# Patient Record
Sex: Male | Born: 1940 | Race: White | Hispanic: No | Marital: Married | Smoking: Former smoker
Health system: Southern US, Community
[De-identification: ages and names within clinical notes are randomized; demographics above are authoritative.]

## PROBLEM LIST (undated history)

## (undated) DIAGNOSIS — R9389 Abnormal findings on diagnostic imaging of other specified body structures: Secondary | ICD-10-CM

## (undated) DIAGNOSIS — Z7901 Long term (current) use of anticoagulants: Secondary | ICD-10-CM

## (undated) DIAGNOSIS — E669 Obesity, unspecified: Secondary | ICD-10-CM

## (undated) DIAGNOSIS — I456 Pre-excitation syndrome: Secondary | ICD-10-CM

## (undated) DIAGNOSIS — I480 Paroxysmal atrial fibrillation: Secondary | ICD-10-CM

## (undated) DIAGNOSIS — R7302 Impaired glucose tolerance (oral): Secondary | ICD-10-CM

## (undated) DIAGNOSIS — I259 Chronic ischemic heart disease, unspecified: Secondary | ICD-10-CM

## (undated) DIAGNOSIS — I10 Essential (primary) hypertension: Secondary | ICD-10-CM

## (undated) DIAGNOSIS — IMO0001 Reserved for inherently not codable concepts without codable children: Secondary | ICD-10-CM

## (undated) DIAGNOSIS — G4733 Obstructive sleep apnea (adult) (pediatric): Secondary | ICD-10-CM

## (undated) HISTORY — DX: Reserved for inherently not codable concepts without codable children: IMO0001

## (undated) HISTORY — DX: Essential (primary) hypertension: I10

## (undated) HISTORY — DX: Chronic ischemic heart disease, unspecified: I25.9

## (undated) HISTORY — DX: Paroxysmal atrial fibrillation: I48.0

## (undated) HISTORY — DX: Abnormal findings on diagnostic imaging of other specified body structures: R93.89

## (undated) HISTORY — DX: Obstructive sleep apnea (adult) (pediatric): G47.33

## (undated) HISTORY — DX: Pre-excitation syndrome: I45.6

## (undated) HISTORY — DX: Obesity, unspecified: E66.9

## (undated) HISTORY — DX: Long term (current) use of anticoagulants: Z79.01

## (undated) HISTORY — PX: TONSILLECTOMY: SUR1361

## (undated) HISTORY — PX: CORONARY ANGIOPLASTY: SHX604

## (undated) HISTORY — DX: Impaired glucose tolerance (oral): R73.02

---

## 1991-05-25 HISTORY — PX: CARDIAC CATHETERIZATION: SHX172

## 1991-05-25 HISTORY — PX: CORONARY ANGIOPLASTY: SHX604

## 1994-05-24 DIAGNOSIS — I456 Pre-excitation syndrome: Secondary | ICD-10-CM

## 1994-05-24 HISTORY — DX: Pre-excitation syndrome: I45.6

## 2000-07-26 ENCOUNTER — Ambulatory Visit (HOSPITAL_COMMUNITY): Admission: RE | Admit: 2000-07-26 | Discharge: 2000-07-26 | Payer: Self-pay | Admitting: Orthopedic Surgery

## 2000-07-26 ENCOUNTER — Encounter: Payer: Self-pay | Admitting: Orthopedic Surgery

## 2000-10-03 ENCOUNTER — Encounter: Admission: RE | Admit: 2000-10-03 | Discharge: 2000-10-03 | Payer: Self-pay | Admitting: Orthopedic Surgery

## 2000-10-03 ENCOUNTER — Encounter: Payer: Self-pay | Admitting: Orthopedic Surgery

## 2000-10-04 ENCOUNTER — Ambulatory Visit (HOSPITAL_BASED_OUTPATIENT_CLINIC_OR_DEPARTMENT_OTHER): Admission: RE | Admit: 2000-10-04 | Discharge: 2000-10-04 | Payer: Self-pay | Admitting: Orthopedic Surgery

## 2002-05-24 HISTORY — PX: CARDIAC CATHETERIZATION: SHX172

## 2002-08-24 ENCOUNTER — Encounter: Admission: RE | Admit: 2002-08-24 | Discharge: 2002-08-24 | Payer: Self-pay | Admitting: Geriatric Medicine

## 2002-08-24 ENCOUNTER — Encounter: Payer: Self-pay | Admitting: Geriatric Medicine

## 2002-10-10 ENCOUNTER — Encounter: Admission: RE | Admit: 2002-10-10 | Discharge: 2003-01-08 | Payer: Self-pay | Admitting: Geriatric Medicine

## 2003-01-09 ENCOUNTER — Encounter: Admission: RE | Admit: 2003-01-09 | Discharge: 2003-04-09 | Payer: Self-pay | Admitting: Geriatric Medicine

## 2003-01-22 ENCOUNTER — Ambulatory Visit (HOSPITAL_COMMUNITY): Admission: RE | Admit: 2003-01-22 | Discharge: 2003-01-22 | Payer: Self-pay | Admitting: Internal Medicine

## 2003-04-10 ENCOUNTER — Encounter: Admission: RE | Admit: 2003-04-10 | Discharge: 2003-07-09 | Payer: Self-pay | Admitting: Geriatric Medicine

## 2003-07-24 ENCOUNTER — Encounter: Admission: RE | Admit: 2003-07-24 | Discharge: 2003-10-22 | Payer: Self-pay | Admitting: Geriatric Medicine

## 2004-01-01 ENCOUNTER — Encounter: Admission: RE | Admit: 2004-01-01 | Discharge: 2004-03-31 | Payer: Self-pay | Admitting: Geriatric Medicine

## 2004-08-12 ENCOUNTER — Encounter: Admission: RE | Admit: 2004-08-12 | Discharge: 2004-11-10 | Payer: Self-pay | Admitting: Geriatric Medicine

## 2004-11-18 ENCOUNTER — Encounter: Admission: RE | Admit: 2004-11-18 | Discharge: 2005-02-16 | Payer: Self-pay | Admitting: Geriatric Medicine

## 2005-06-23 ENCOUNTER — Encounter: Admission: RE | Admit: 2005-06-23 | Discharge: 2005-09-21 | Payer: Self-pay | Admitting: Physician Assistant

## 2005-06-29 ENCOUNTER — Ambulatory Visit (HOSPITAL_COMMUNITY): Admission: RE | Admit: 2005-06-29 | Discharge: 2005-06-29 | Payer: Self-pay | Admitting: Gastroenterology

## 2006-07-26 ENCOUNTER — Encounter: Admission: RE | Admit: 2006-07-26 | Discharge: 2006-07-26 | Payer: Self-pay | Admitting: Cardiology

## 2007-10-26 ENCOUNTER — Ambulatory Visit (HOSPITAL_BASED_OUTPATIENT_CLINIC_OR_DEPARTMENT_OTHER): Admission: RE | Admit: 2007-10-26 | Discharge: 2007-10-26 | Payer: Self-pay | Admitting: Orthopedic Surgery

## 2007-12-26 ENCOUNTER — Ambulatory Visit: Payer: Self-pay | Admitting: Family Medicine

## 2008-01-02 ENCOUNTER — Ambulatory Visit: Payer: Self-pay | Admitting: Family Medicine

## 2008-01-09 ENCOUNTER — Ambulatory Visit: Payer: Self-pay | Admitting: Family Medicine

## 2008-01-16 ENCOUNTER — Ambulatory Visit: Payer: Self-pay | Admitting: Family Medicine

## 2008-02-06 ENCOUNTER — Ambulatory Visit: Payer: Self-pay | Admitting: Family Medicine

## 2008-02-09 ENCOUNTER — Encounter: Payer: Self-pay | Admitting: Family Medicine

## 2008-02-13 ENCOUNTER — Ambulatory Visit: Payer: Self-pay | Admitting: Family Medicine

## 2008-02-20 ENCOUNTER — Ambulatory Visit: Payer: Self-pay | Admitting: Family Medicine

## 2008-02-20 ENCOUNTER — Encounter (INDEPENDENT_AMBULATORY_CARE_PROVIDER_SITE_OTHER): Payer: Self-pay | Admitting: *Deleted

## 2008-02-20 LAB — CONVERTED CEMR LAB
ALT: 19 units/L (ref 0–53)
AST: 26 units/L (ref 0–37)
Albumin: 4.4 g/dL (ref 3.5–5.2)
Alkaline Phosphatase: 78 units/L (ref 39–117)
BUN: 24 mg/dL — ABNORMAL HIGH (ref 6–23)
CO2: 20 meq/L (ref 19–32)
Calcium: 9.4 mg/dL (ref 8.4–10.5)
Chloride: 105 meq/L (ref 96–112)
Creatinine, Ser: 0.84 mg/dL (ref 0.40–1.50)
Glucose, Bld: 105 mg/dL — ABNORMAL HIGH (ref 70–99)
Potassium: 4.5 meq/L (ref 3.5–5.3)
Sodium: 138 meq/L (ref 135–145)
Total Bilirubin: 0.6 mg/dL (ref 0.3–1.2)
Total Protein: 7 g/dL (ref 6.0–8.3)

## 2008-02-27 ENCOUNTER — Ambulatory Visit: Payer: Self-pay | Admitting: Family Medicine

## 2008-03-05 ENCOUNTER — Ambulatory Visit: Payer: Self-pay | Admitting: Family Medicine

## 2008-03-19 ENCOUNTER — Ambulatory Visit: Payer: Self-pay | Admitting: Family Medicine

## 2008-08-01 ENCOUNTER — Encounter: Admission: RE | Admit: 2008-08-01 | Discharge: 2008-08-01 | Payer: Self-pay | Admitting: Orthopedic Surgery

## 2009-02-20 ENCOUNTER — Encounter: Admission: RE | Admit: 2009-02-20 | Discharge: 2009-02-20 | Payer: Self-pay | Admitting: Orthopedic Surgery

## 2009-03-11 ENCOUNTER — Encounter: Payer: Self-pay | Admitting: Cardiovascular Disease

## 2009-03-11 ENCOUNTER — Ambulatory Visit: Payer: Self-pay

## 2009-03-13 ENCOUNTER — Encounter: Payer: Self-pay | Admitting: Cardiovascular Disease

## 2009-03-13 DIAGNOSIS — M79609 Pain in unspecified limb: Secondary | ICD-10-CM | POA: Insufficient documentation

## 2009-07-22 ENCOUNTER — Encounter: Payer: Self-pay | Admitting: Internal Medicine

## 2009-08-15 ENCOUNTER — Encounter: Admission: RE | Admit: 2009-08-15 | Discharge: 2009-08-15 | Payer: Self-pay | Admitting: Cardiology

## 2009-08-20 ENCOUNTER — Ambulatory Visit (HOSPITAL_COMMUNITY): Admission: RE | Admit: 2009-08-20 | Discharge: 2009-08-20 | Payer: Self-pay | Admitting: Cardiology

## 2009-09-04 ENCOUNTER — Encounter: Payer: Self-pay | Admitting: Internal Medicine

## 2009-09-04 ENCOUNTER — Encounter: Admission: RE | Admit: 2009-09-04 | Discharge: 2009-09-04 | Payer: Self-pay | Admitting: Cardiology

## 2009-10-12 ENCOUNTER — Ambulatory Visit (HOSPITAL_BASED_OUTPATIENT_CLINIC_OR_DEPARTMENT_OTHER): Admission: RE | Admit: 2009-10-12 | Discharge: 2009-10-12 | Payer: Self-pay | Admitting: *Deleted

## 2009-10-12 ENCOUNTER — Encounter: Payer: Self-pay | Admitting: Internal Medicine

## 2009-11-01 ENCOUNTER — Ambulatory Visit: Payer: Self-pay | Admitting: Internal Medicine

## 2009-12-19 ENCOUNTER — Ambulatory Visit: Payer: Self-pay | Admitting: Internal Medicine

## 2009-12-19 DIAGNOSIS — G4730 Sleep apnea, unspecified: Secondary | ICD-10-CM

## 2009-12-19 DIAGNOSIS — G4710 Hypersomnia, unspecified: Secondary | ICD-10-CM | POA: Insufficient documentation

## 2009-12-21 DIAGNOSIS — I4891 Unspecified atrial fibrillation: Secondary | ICD-10-CM | POA: Insufficient documentation

## 2009-12-21 DIAGNOSIS — I2510 Atherosclerotic heart disease of native coronary artery without angina pectoris: Secondary | ICD-10-CM | POA: Insufficient documentation

## 2010-01-21 ENCOUNTER — Ambulatory Visit: Payer: Self-pay | Admitting: Cardiology

## 2010-01-27 ENCOUNTER — Encounter: Payer: Self-pay | Admitting: Internal Medicine

## 2010-01-27 ENCOUNTER — Telehealth (INDEPENDENT_AMBULATORY_CARE_PROVIDER_SITE_OTHER): Payer: Self-pay | Admitting: *Deleted

## 2010-01-27 ENCOUNTER — Encounter: Admission: RE | Admit: 2010-01-27 | Discharge: 2010-01-27 | Payer: Self-pay | Admitting: Cardiology

## 2010-01-29 ENCOUNTER — Ambulatory Visit: Payer: Self-pay | Admitting: Internal Medicine

## 2010-02-05 ENCOUNTER — Encounter: Payer: Self-pay | Admitting: Internal Medicine

## 2010-03-05 ENCOUNTER — Encounter: Payer: Self-pay | Admitting: Internal Medicine

## 2010-03-18 ENCOUNTER — Telehealth: Payer: Self-pay | Admitting: Internal Medicine

## 2010-03-19 ENCOUNTER — Telehealth (INDEPENDENT_AMBULATORY_CARE_PROVIDER_SITE_OTHER): Payer: Self-pay | Admitting: *Deleted

## 2010-03-23 ENCOUNTER — Ambulatory Visit: Payer: Self-pay | Admitting: Cardiology

## 2010-04-29 ENCOUNTER — Encounter: Payer: Self-pay | Admitting: Internal Medicine

## 2010-05-27 ENCOUNTER — Ambulatory Visit (HOSPITAL_COMMUNITY)
Admission: RE | Admit: 2010-05-27 | Discharge: 2010-05-27 | Payer: Self-pay | Source: Home / Self Care | Attending: Cardiology | Admitting: Cardiology

## 2010-05-27 ENCOUNTER — Encounter: Payer: Self-pay | Admitting: Internal Medicine

## 2010-05-27 LAB — GLUCOSE, CAPILLARY: Glucose-Capillary: 128 mg/dL — ABNORMAL HIGH (ref 70–99)

## 2010-06-02 ENCOUNTER — Ambulatory Visit: Payer: Self-pay | Admitting: Cardiology

## 2010-06-13 ENCOUNTER — Other Ambulatory Visit: Payer: Self-pay | Admitting: Cardiology

## 2010-06-13 DIAGNOSIS — R911 Solitary pulmonary nodule: Secondary | ICD-10-CM

## 2010-06-15 ENCOUNTER — Encounter: Payer: Self-pay | Admitting: Orthopedic Surgery

## 2010-06-22 ENCOUNTER — Ambulatory Visit: Admit: 2010-06-22 | Payer: Self-pay | Admitting: Internal Medicine

## 2010-06-25 NOTE — Assessment & Plan Note (Signed)
Summary: sleep pt/apc   Primary Provider/Referring Provider:  Stoneking  CC:  Sleep Consult-Dr.[name]-sleep study attached..  History of Present Illness: Jan 10, 2010- 70 yoM referred courtesy of Dr Deborah Chalk because of sleep apnea. His wife has told him for years she thought he had the diagnosis, because of loud, interrupted snoring. 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 Bedtime 02-1029 PM, short sleep latency, waking 2-3 x/ night for bathroom, before up 630-7AM. Comfortable in bed. Admits it is easy to get drowsy with quiet sitting, but he doesn't admit difficulty with alert driving.  Hx bronchits 4 x/ year. CXR and CT chest at [facility] this year showed a RLL lesion. He ]has sent the images to doctors he knows at [facility] for review.  Hx CAD/ angioplasty at [facility]. Tonsils out. NPSG - 10/12/09- Severe OSA, AHI 59.8/hr with CPAP toitration to 15/ AHI 3.7/hr  Preventive Screening-Counseling & Management  Alcohol-Tobacco     Smoking Status: quit > 6 months     Packs/Day: 1.0     Year Started: Age 59     Year Quit: 1980  Past History:  Family History: Last updated: January 10, 2010 Sister- AF Mother - died old age, Alzheimers Father- died age 71, [place]  Social History: Last updated: 10-Jan-2010 Married with children- wife has recurrent ovarian cancer  Risk Factors: Smoking Status: quit > 6 months (Jan 10, 2010) Packs/Day: 1.0 (01-10-2010)  Past Medical History: Obstructive Sleep Apnea- NPSG 10/12/09- AHI 59.8/ hr Hx WPW- ablation Coronary Heart Disease- angioplasty/ balloon/ rotablator Abnormal CT RLL  Past Surgical History: Coronary angiopalsty Ablation for WPW Bilateral knee arthroscopy  Family History: Sister- AF Mother - died old age, Alzheimers Father- died age 82, [place]  Social History: Married with children- wife has recurrent ovarian cancer Smoking Status:  quit > 6 months Packs/Day:  1.0  Review of Systems      See  HPI       The patient complains of non-productive cough.  The patient denies shortness of breath with activity, shortness of breath at rest, productive cough, coughing up blood, chest pain, irregular heartbeats, acid heartburn, indigestion, loss of appetite, weight change, abdominal pain, difficulty swallowing, sore throat, tooth/dental problems, headaches, nasal congestion/difficulty breathing through nose, sneezing, itching, ear ache, anxiety, depression, hand/feet swelling, joint stiffness or pain, rash, change in color of mucus, and fever.    Vital Signs:  Patient profile:   70 year old male Weight:      321.13 pounds O2 Sat:      92 % on Room air Pulse rate:   63 / minute BP sitting:   126 / 82  (right arm) Cuff size:   large  Vitals Entered By: Reynaldo Minium CMA (2010/01/10 11:40 AM)  O2 Flow:  Room air CC: Sleep Consult-Dr.[name]-sleep study attached.    Physical Exam  Additional Exam:  General: A/Ox3; pleasant and cooperative, NAD, SKIN: no rash, lesions NODES: no lymphadenopathy HEENT: [place]/AT, EOM- WNL, Conjuctivae- clear, PERRLA, TM-WNL, Nose- clear, Throat- clear and wnl, Mallampati  II-III NECK: Supple w/ fair ROM, JVD- none, normal carotid impulses w/o bruits Thyroid- normal to palpation CHEST: Clear to P&A HEART: RRR, no m/g/r heard ABDOMEN: Soft and nl; nml bowel sounds; no organomegaly or masses noted. Overweight WJX:BJYN, pulse regular but not easily felt, no edema  NEURO: Grossly intact to observation      Impression & Recommendations:  Problem # 1:  HYPERSOMNIA WITH SLEEP APNEA UNSPECIFIED (ICD-780.53) Severe obstructive sleep apnea. We discussed  the medical concerns, physiology, available treatments, and driving safety. He understands OSA is an extra strain on his heart. He is an appropriate candidate for home CPAP with encouragement to lose weight.  Medications Added to Medication List This Visit: 1)  Cpap 15   Other Orders: Consultation Level IV  (16109) DME Referral (DME)  Patient Instructions: 1)  Please schedule a follow-up appointment in 1 month. 2)  See [facility] about getting started with CPAP

## 2010-06-25 NOTE — Progress Notes (Signed)
Summary: cpap f/u request  Phone Note Call from Patient Call back at Home Phone [phone]   Caller: Patient Call For: young Summary of Call: pt requests to be seen this wk for cpap f/u (says it was to be w/in 30 days of download). pt out of town next week. ok to leave msg on phone.  Initial call taken by: Tivis Ringer, CNA,  January 27, 2010 9:30 AM  Follow-up for Phone Call        Can work pt in on Thursday afternoon at [address].Reynaldo Minium CMA  January 28, 2010 4:46 PM   pt schedueld to see CY on thursday at 4:15pm. Carron Curie CMA  January 28, 2010 5:07 PM Appt sched and pt aware per JC. Vernie Murders  January 28, 2010 5:33 PM

## 2010-06-25 NOTE — Progress Notes (Signed)
Summary: cpap pressure  Phone Note Call from Patient Call back at Home Phone [phone]   Caller: Patient Call For: young  Summary of Call: pt states that [facility] told him to call and "make sure dr young only wanted to increase cpap pressure to 16" (from 15).  Initial call taken by: Tivis Ringer, CNA,  March 19, 2010 3:44 PM  Follow-up for Phone Call        Spoke with pt.  He states that [facility] wanted him to call [facility] and be sure that he meant to change his cpap pressure from 15 to 16, being that "the change is so small".  I ensured that this is the order that was intended to be sent, but will forward to [facility] to make absolutely sure.  Pls advise thanks! Follow-up by: Vernie Murders,  March 19, 2010 4:00 PM  Additional Follow-up for Phone Call Additional follow up Details #1::        Spoke with pt and advised per Dr Maple Hudson that this was the order intended.  Pt states that they already came out and changed pressure. Additional Follow-up by: Vernie Murders,  March 19, 2010 5:01 PM

## 2010-06-25 NOTE — Assessment & Plan Note (Signed)
Summary: follow up visit-CPAP/kcw   Primary Krina Mraz/Referring Khalen Styer:  Richard Shannon  CC:  Follow up visit-CPAP.  History of Present Illness:  History of Present Illness: 12-30-2009- 68 yoM referred courtesy of Dr Deborah Chalk because of sleep apnea. His wife has told him for years she thought he had the diagnosis, because of loud, interrupted snoring. 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 Bedtime 02-1029 PM, short sleep latency, waking 2-3 x/ night for bathroom, before up 630-7AM. Comfortable in bed. Admits it is easy to get drowsy with quiet sitting, but he doesn't admit difficulty with alert driving.  Hx bronchits 4 x/ year. CXR and CT chest at [facility] this year showed a RLL lesion. He has sent the images to doctors he knows at [facility] for review.  Hx CAD/ angioplasty at [facility]. Tonsils out. NPSG - 10/12/09- Severe OSA, AHI 59.8/hr with CPAP titration to 15/ AHI 3.7/hr  January 29, 2010- OSA, Hx AFib, hx lung nodule He worked hard with [facility] and is finally satisfied that he has a good fit with the current full face mask. He is still using pressure at 15. Understands to reset the ramp if up to bathroom. He says he is sleeping better than he has in years. He is sleeping through the night almost every night. Less daytime sleepiness and thinks snoring is stopped. Using CPAP all night every night since he got it. He had cardioversion of Afib earlier this year. 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 Had f/u CT this week to compare and awaits the report.   Preventive Screening-Counseling & Management  Alcohol-Tobacco     Smoking Status: quit > 6 months     Packs/Day: 1.0     Year Started: Age 56     Year Quit: 1980  Current Medications (verified): 1)  Cpap 15 .... Ahc 2)  Metoprolol Succinate 25 Mg Xr24h-Tab (Metoprolol Succinate) .... Two Times A Day 3)  Singulair 10 Mg Tabs (Montelukast Sodium) .... Take 1 By Mouth Once Daily 4)   Simvastatin 80 Mg Tabs (Simvastatin) .... Take 1 By Mouth Once Daily 5)  Cardizem La 240 Mg Xr24h-Tab (Diltiazem Hcl Coated Beads) .... Take 1 By Mouth Once Daily 6)  Multivitamins  Tabs (Multiple Vitamin) .... Take 1 By Mouth Once Daily 7)  Beta Carotene 23557 Unit Caps (Beta Carotene) .... Take 1 By Mouth Once Daily 8)  Vitamin C 1000 Mg Tabs (Ascorbic Acid) .... Two Times A Day 9)  Fish Oil 1200 Mg Caps (Omega-3 Fatty Acids) .... Two Times A Day 10)  Enalapril Maleate 20 Mg Tabs (Enalapril Maleate) .... Take 1 By Mouth Once Daily  Allergies (verified): 1)  ! Sulfa  Past History:  Past Medical History: Last updated: 12-30-09 Obstructive Sleep Apnea- NPSG 10/12/09- AHI 59.8/ hr Hx WPW- ablation Coronary Heart Disease- angioplasty/ balloon/ rotablator Abnormal CT RLL  Past Surgical History: Last updated: 30-Dec-2009 Coronary angiopalsty Ablation for WPW Bilateral knee arthroscopy  Family History: Last updated: 30-Dec-2009 Sister- AF Mother - died old age, Alzheimers Father- died age 50, [place]  Social History: Last updated: December 30, 2009 Married with children- wife has recurrent ovarian cancer  Risk Factors: Smoking Status: quit > 6 months (01/29/2010) Packs/Day: 1.0 (01/29/2010)  Review of Systems      See HPI  The patient denies shortness of breath at rest, productive cough, non-productive cough, coughing up blood, chest pain, irregular heartbeats, acid heartburn, indigestion, loss of appetite, weight change, abdominal pain, difficulty swallowing, sore  throat, tooth/dental problems, headaches, nasal congestion/difficulty breathing through nose, and sneezing.    Vital Signs:  Patient profile:   70 year old male Weight:      325.50 pounds O2 Sat:      93 % on Room air Pulse rate:   59 / minute BP sitting:   124 / 72  (left arm) Cuff size:   large  Vitals Entered By: Reynaldo Minium CMA (January 29, 2010 2:15 PM)  O2 Flow:  Room air CC: Follow up  visit-CPAP   Physical Exam  Additional Exam:  General: A/Ox3; pleasant and cooperative, NAD, SKIN: no rash, lesions NODES: no lymphadenopathy HEENT: [place]/AT, EOM- WNL, Conjuctivae- clear, PERRLA, TM-WNL, Nose- clear, Throat- clear and wnl, Mallampati  II-III NECK: Supple w/ fair ROM, JVD- none, normal carotid impulses w/o bruits Thyroid- normal to palpation CHEST: Clear to P&A HEART: RRR, no m/g/r heard ABDOMEN: Significantly overweight [id number], pulse regular but not easily felt, no edema  NEURO: Grossly intact to observation, alert and talkative.      Impression & Recommendations:  Problem # 1:  HYPERSOMNIA WITH SLEEP APNEA UNSPECIFIED (ICD-780.53)  Great compliance and control on CPAP 15 from [facility]. We will await downloads but don't anticipate barriers to successful use. Weight loss would help.  Medications Added to Medication List This Visit: 1)  Cpap 15  .... Ahc 2)  Metoprolol Succinate 25 Mg Xr24h-tab (Metoprolol succinate) .... Two times a day 3)  Singulair 10 Mg Tabs (Montelukast sodium) .... Take 1 by mouth once daily 4)  Simvastatin 80 Mg Tabs (Simvastatin) .... Take 1 by mouth once daily 5)  Cardizem La 240 Mg Xr24h-tab (Diltiazem hcl coated beads) .... Take 1 by mouth once daily 6)  Multivitamins Tabs (Multiple vitamin) .... Take 1 by mouth once daily 7)  Beta Carotene 91478 Unit Caps (Beta carotene) .... Take 1 by mouth once daily 8)  Vitamin C 1000 Mg Tabs (Ascorbic acid) .... Two times a day 9)  Fish Oil 1200 Mg Caps (Omega-3 fatty acids) .... Two times a day 10)  Enalapril Maleate 20 Mg Tabs (Enalapril maleate) .... Take 1 by mouth once daily  Other Orders: Est. Patient Level III (29562) Flu Vaccine 6yrs + MEDICARE PATIENTS (Z3086) [facility] Flu vaccine - MCR (V7846)  Patient Instructions: 1)  Please schedule a follow-up appointment in 4 months. 2)  We will work with you and [facility] as needed. Let us know if something is a concern.  Flu Vaccine  Consent Questions     Do you have a history of severe allergic reactions to this vaccine? no    Any prior history of allergic reactions to egg and/or gelatin? no    Do you have a sensitivity to the preservative Thimersol? no    Do you have a past history of Guillan-Barre Syndrome? no    Do you currently have an acute febrile illness? no    Have you ever had a severe reaction to latex? no    Vaccine information given and explained to patient? yes    Are you currently pregnant? no    Lot Number:[id number]   Exp Date:11/21/2010   Site Given  Left Deltoid IM Reynaldo Minium CMA  January 29, 2010 5:13 PM    .lbmedflu

## 2010-06-25 NOTE — Progress Notes (Signed)
Summary: Change CPAP to 16  Phone Note Other Incoming   Summary of Call: CPAP compliance check- good compliance but AHI still 9.6 on 15 cwp. Will try increase to 16.    New/Updated Medications: * CPAP 16 [facility]

## 2010-07-05 ENCOUNTER — Telehealth: Payer: Self-pay | Admitting: Internal Medicine

## 2010-07-06 ENCOUNTER — Ambulatory Visit (INDEPENDENT_AMBULATORY_CARE_PROVIDER_SITE_OTHER): Payer: MEDICARE | Admitting: Internal Medicine

## 2010-07-06 ENCOUNTER — Encounter: Payer: Self-pay | Admitting: Internal Medicine

## 2010-07-06 DIAGNOSIS — J984 Other disorders of lung: Secondary | ICD-10-CM

## 2010-07-06 DIAGNOSIS — G4710 Hypersomnia, unspecified: Secondary | ICD-10-CM

## 2010-07-15 NOTE — Assessment & Plan Note (Signed)
Summary: rov/jd   Primary Provider/Referring Provider:  Stoneking  CC:  Cpap follow up.  Pt states cpap is working well.  Wearing it everynight for approx 7-8 hours each night.  Denies problems with mask and pressure.Marland Kitchen  History of Present Illness: January 29, 2010- OSA, Hx AFib, hx lung nodule He worked hard with [facility] and is finally satisfied that he has a good fit with the current full face mask. He is still using pressure at 15. Understands to reset the ramp if up to bathroom. He says he is sleeping better than he has in years. He is sleeping through the night almost every night. Less daytime sleepiness and thinks snoring is stopped. Using CPAP all night every night since he got it. He had cardioversion of Afib earlier this year. 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 Had f/u CT this week to compare and awaits the report.   02-Aug-2010- OSA, Hx AFib, hx lung nodule Nurse-CC: Cpap follow up.  Pt states cpap is working well.  Wearing it every night for approx 7-8 hours each night.  Denies problems with mask and pressure. Lung nodule- granuloma- hadf/[facility] in September. Then got PET which is negative, ordered by Dr Deborah Chalk. We discussed guidelines for following nodules and lack of certainty.  OSA- He is very pleased with CPAP, wearing it all night every night, sleeps better and won't travel without it. Pressure is 16.  Denies shortness of breath or cough.    Preventive Screening-Counseling & Management  Alcohol-Tobacco     Smoking Status: quit > 6 months     Packs/Day: 1.0     Year Started: Age 48     Year Quit: 1980  Current Medications (verified): 1)  Metoprolol Succinate 25 Mg Xr24h-Tab (Metoprolol Succinate) .... Two Times A Day 2)  Singulair 10 Mg Tabs (Montelukast Sodium) .... Take 1 By Mouth Once Daily 3)  Simvastatin 80 Mg Tabs (Simvastatin) .... Take 1 By Mouth Once Daily 4)  Cardizem La 240 Mg Xr24h-Tab (Diltiazem Hcl Coated Beads) .... Take 1 By Mouth  Once Daily 5)  Multivitamins  Tabs (Multiple Vitamin) .... Take 1 By Mouth Once Daily 6)  Beta Carotene 65784 Unit Caps (Beta Carotene) .... Take 1 By Mouth Once Daily 7)  Vitamin C 1000 Mg Tabs (Ascorbic Acid) .... Two Times A Day 8)  Fish Oil 1200 Mg Caps (Omega-3 Fatty Acids) .... Two Times A Day 9)  Enalapril Maleate 20 Mg Tabs (Enalapril Maleate) .... Take 1 By Mouth Once Daily 10)  Cpap .Marland KitchenMarland Kitchen. 16 Ahc  Allergies (verified): 1)  ! Sulfa  Past History:  Past Medical History: Last updated: 12/19/2009 Obstructive Sleep Apnea- NPSG 10/12/09- AHI 59.8/ hr Hx WPW- ablation Coronary Heart Disease- angioplasty/ balloon/ rotablator Abnormal CT RLL  Past Surgical History: Last updated: 12/19/2009 Coronary angiopalsty Ablation for WPW Bilateral knee arthroscopy  Family History: Last updated: 2010-08-02 Sister- AF Mother - died old age, Alzheimers Father- died age 52, [place] Son- RDH III- has OSA- off CPAP after weight loss.   Social History: Last updated: 08-02-2010 Married with children- wife has recurrent ovarian cancer Former smoker  Risk Factors: Smoking Status: quit > 6 months (02-Aug-2010) Packs/Day: 1.0 (Aug 02, 2010)  Family History: Sister- AF Mother - died old age, Alzheimers Father- died age 51, [place] Son- RDH III- has OSA- off CPAP after weight loss.   Social History: Married with children- wife has recurrent ovarian cancer Former smoker  Review of Systems  See HPI       The patient complains of shortness of breath with activity.  The patient denies shortness of breath at rest, productive cough, non-productive cough, coughing up blood, chest pain, acid heartburn, indigestion, loss of appetite, weight change, abdominal pain, difficulty swallowing, sore throat, tooth/dental problems, headaches, nasal congestion/difficulty breathing through nose, sneezing, itching, ear ache, hand/feet swelling, rash, and change in color of mucus.         No recent  palpitation  Vital Signs:  Patient profile:   70 year old male Height:      69 inches Weight:      331.25 pounds BMI:     49.09 O2 Sat:      92 % on Room air Pulse rate:   53 / minute BP sitting:   132 / 74  (right arm) Cuff size:   large  Vitals Entered By: Gweneth Dimitri RN (July 06, 2010 11:21 AM)  O2 Flow:  Room air CC: Cpap follow up.  Pt states cpap is working well.  Wearing it everynight for approx 7-8 hours each night.  Denies problems with mask and pressure. Comments Medications reviewed with patient Daytime contact number verified with patient. Gweneth Dimitri RN  July 06, 2010 11:21 AM    Physical Exam  Additional Exam:  General: A/Ox3; pleasant and cooperative, NAD,  significantly overweight SKIN: no rash, lesions NODES: no lymphadenopathy HEENT: [place]/AT, EOM- WNL, Conjuctivae- clear, PERRLA, TM-WNL, Nose- clear, Throat- clear and wnl, Mallampati  II-III NECK: Supple w/ fair ROM, JVD- none, normal carotid impulses w/o bruits Thyroid- normal to palpation CHEST: Clear to P&A HEART: RRR, no m/g/r heard- currently in sinus rhythm by exam.  ABDOMEN: Significantly overweight ZOX:WRUE, pulse regular but not easily felt, no edema  NEURO: Grossly intact to observation, alert and talkative.      Impression & Recommendations:  Problem # 1:  HYPERSOMNIA WITH SLEEP APNEA UNSPECIFIED (ICD-780.53)  He is very pleased, recognizing improved alertness, driving safety and quality of life with regular use of CPAP.   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   Medications Added to Medication List This Visit: 1)  Cpap  .Marland KitchenMarland Kitchen. 16 ahc  Other Orders: Est. Patient Level III (45409)  Patient Instructions: 1)  Please schedule a follow-up appointment in 1 year. 2)  Continue CPAP at 16  3)  We will contact Dt [name]'s office for the CT scan report of your lung nodule.

## 2010-07-15 NOTE — Progress Notes (Signed)
Summary: [facility]- patient lost weight and d/c'd CPAP  Phone Note Other Incoming   Summary of Call: [facility]- reports patient reported to them  he lost 40 lbs with surgery and no longer needs CPAP, so he turned it back in against advice.  Initial call taken by: Waymon Budge MD,  July 05, 2010 10:10 AM     Appended Document: [facility]- patient lost weight and d/c'd CPAP Error- It was patient's son RDH III who lost weight and turned in CPAP.

## 2010-07-20 ENCOUNTER — Ambulatory Visit (INDEPENDENT_AMBULATORY_CARE_PROVIDER_SITE_OTHER): Payer: MEDICARE | Admitting: Cardiology

## 2010-07-20 DIAGNOSIS — I4891 Unspecified atrial fibrillation: Secondary | ICD-10-CM

## 2010-07-23 DIAGNOSIS — IMO0001 Reserved for inherently not codable concepts without codable children: Secondary | ICD-10-CM

## 2010-07-23 HISTORY — DX: Reserved for inherently not codable concepts without codable children: IMO0001

## 2010-08-04 ENCOUNTER — Encounter (HOSPITAL_COMMUNITY): Payer: MEDICARE

## 2010-08-12 ENCOUNTER — Ambulatory Visit (HOSPITAL_COMMUNITY): Payer: Medicare Other | Attending: Cardiology | Admitting: Radiology

## 2010-08-12 VITALS — Ht 69.0 in | Wt 318.0 lb

## 2010-08-12 DIAGNOSIS — I2510 Atherosclerotic heart disease of native coronary artery without angina pectoris: Secondary | ICD-10-CM

## 2010-08-12 DIAGNOSIS — R0602 Shortness of breath: Secondary | ICD-10-CM

## 2010-08-12 MED ORDER — REGADENOSON 0.4 MG/5ML IV SOLN
0.4000 mg | Freq: Once | INTRAVENOUS | Status: AC
  Administered 2010-08-12: 0.4 mg via INTRAVENOUS

## 2010-08-12 MED ORDER — TECHNETIUM TC 99M TETROFOSMIN IV KIT
30.0000 | PACK | Freq: Once | INTRAVENOUS | Status: AC | PRN
  Administered 2010-08-12: 30 via INTRAVENOUS

## 2010-08-12 MED ORDER — METOPROLOL SUCCINATE ER 50 MG PO TB24
50.0000 mg | ORAL_TABLET | Freq: Every day | ORAL | Status: AC
  Administered 2010-08-12: 50 mg via ORAL

## 2010-08-12 NOTE — Progress Notes (Signed)
[facility] SITE 3 NUCLEAR MED [address] [place] [address] [phone]  Cardiology Nuclear Med Study Richard Shannon male 1941/04/18   Nuclear Med Background Indication for Stress Test:  Evaluation for Ischemia and PTCA Patency History:  Ablation, Angioplasty, Cardioversion, Heart Catheterization and Myocardial Perfusion Study Cardiac Risk Factors: Family History - CAD, Hypertension, Lipids and Obesity  Symptoms:  Palpitations   Nuclear Pre-Procedure Caffeine/Decaff Intake:  None NPO After: 6:00 PM   Lungs:  clear IV 0.9% NS with Angio Cath:  20g  IV Site: R Hand  IV Started by:  [name], RN  Chest Size (in):  54 Cup Size:  N/A  Height: 5\' 9"  (1.753 m)  Weight:  318 lb (144.244 kg)  BMI:  Body mass index is 46.96 kg/(m^2). Tech Comments:  Held metoprolol x 24 hrs.This patient came in for a Stress Myoview test. He walked on the treadmill for  6:21, when he became so SOB and fatigued that he was given 0.4mg  Lexiscan IV. The treadmill was slowed down and the grade was taken down to 0. The patient had  alot of ectopy; PACS,A-Bigemeny, PVCS, and a short run of Vtach. After the patient was in recovery the ectopy continued. The patient didn't take his Metoprolol succinate in the last 24hrs, so he was given metoprolol 50mg  po. [name] advised when he had excessive ectopy to take 50 mg Metoprolol po instead of his normal 25mg  po. The patient states that he felt much better when he was finished and ready to leave. [name] EMTP    Nuclear Med Study 1 or 2 day study: 2 day  Stress Test Type:  Treadmill/Lexiscan  Reading MD: [name], MD Order Authorizing Provider: [name]  Resting Radionuclide: Technetium 74m Tetrofosmin  Resting Radionuclide Dose: 33 mCi   Stress Radionuclide:  Technetium 14m Tetrofosmin  Stress Radionuclide Dose: 33 mCi           Stress Protocol Rest HR: 80 Stress HR: 125  Rest BP: 152/69 Stress BP: 209  Exercise Time: 8:19  min METS: 7.80  Predicted HR: 128 % of Maximum: 84      Dose of Adenosine:  N/A mg Dose of Lexiscan:  0.4 mg  Dose of Atropine:  N/A mg Dose of Dobutamine: N/A  mcg/kg/min (at max HR)  Stress Test Technologist: [name], EMT-P  Nuclear Technologist:  [name], CNMT     Rest Procedure:  Myocardial perfusion imaging was performed at rest 45 minutes following the intravenous administration of Technetium 17m Tetrofosmin. Rest ECG: Sinus Bradycardia with occ PVCS/PACS  Stress Procedure:  The patient received IV Lexiscan 0.4 mg over 15-seconds with concurrent low level exercise and then Technetium 55m Tetrofosmin was injected at 30-seconds while the patient continued walking one more minute.  There were no significant changes with Lexiscan.  Quantitative spect images were obtained after a 45-minute delay. Stress ECG: Insignificant upsloping ST segment depression; 3 beats NSVT.  QPS Raw Data Images:  Acquisition technically good; normal left ventricular size. Stress Images:  There is decreased uptake in the inferior wall. Rest Images:  There is decreased uptake in the inferior wall, less prominent compared to the stress images. 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  Findings Risk Category:  Low risk nuclear study. Clinically Abnormal:  No Ischemia:  Inferior Fixed Defect:  Inferior LV Dysfunction:  No Transient Ischemic Dilatation (Normal <1.22):  1.00 Lung/Heart Ratio (Normal <0.45):  Marland Kitchen  37  Quantitative Gated Spect Images QGS EDV:   ml QGS ESV:   ml QGS cine images:  Not gated. QGS EF:   %  Impression Exercise Capacity:  Lexiscan with no exercise. BP Response:  Hypertensive blood pressure response. Clinical Symptoms:  No chest pain. ECG Impression:  Insignificant upsloping ST segment depression; 3 beats NSVT. 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

## 2010-08-13 ENCOUNTER — Ambulatory Visit (HOSPITAL_COMMUNITY): Payer: Medicare Other | Attending: Cardiology | Admitting: Radiology

## 2010-08-13 DIAGNOSIS — R0789 Other chest pain: Secondary | ICD-10-CM

## 2010-08-13 MED ORDER — TECHNETIUM TC 99M TETROFOSMIN IV KIT
33.0000 | PACK | Freq: Once | INTRAVENOUS | Status: AC | PRN
  Administered 2010-08-12: 33 via INTRAVENOUS

## 2010-08-13 MED ORDER — TECHNETIUM TC 99M TETROFOSMIN IV KIT
33.0000 | PACK | Freq: Once | INTRAVENOUS | Status: AC | PRN
  Administered 2010-08-13: 33 via INTRAVENOUS

## 2010-08-19 ENCOUNTER — Telehealth: Payer: Self-pay | Admitting: *Deleted

## 2010-08-19 NOTE — Telephone Encounter (Signed)
RN left voicemail for pt with nuclear results.  Pt told to call back with any concerns.

## 2010-09-08 ENCOUNTER — Encounter: Payer: Self-pay | Admitting: Geriatric Medicine

## 2010-09-21 ENCOUNTER — Encounter: Payer: Self-pay | Admitting: Cardiology

## 2010-09-21 DIAGNOSIS — I480 Paroxysmal atrial fibrillation: Secondary | ICD-10-CM | POA: Insufficient documentation

## 2010-09-21 DIAGNOSIS — I456 Pre-excitation syndrome: Secondary | ICD-10-CM | POA: Insufficient documentation

## 2010-09-21 DIAGNOSIS — I10 Essential (primary) hypertension: Secondary | ICD-10-CM | POA: Insufficient documentation

## 2010-09-21 DIAGNOSIS — E669 Obesity, unspecified: Secondary | ICD-10-CM | POA: Insufficient documentation

## 2010-09-21 DIAGNOSIS — I999 Unspecified disorder of circulatory system: Secondary | ICD-10-CM | POA: Insufficient documentation

## 2010-09-21 DIAGNOSIS — I259 Chronic ischemic heart disease, unspecified: Secondary | ICD-10-CM | POA: Insufficient documentation

## 2010-09-28 ENCOUNTER — Ambulatory Visit (INDEPENDENT_AMBULATORY_CARE_PROVIDER_SITE_OTHER): Payer: Medicare Other | Admitting: Cardiology

## 2010-09-28 ENCOUNTER — Encounter: Payer: Self-pay | Admitting: Cardiology

## 2010-09-28 DIAGNOSIS — I4891 Unspecified atrial fibrillation: Secondary | ICD-10-CM

## 2010-09-28 DIAGNOSIS — I259 Chronic ischemic heart disease, unspecified: Secondary | ICD-10-CM

## 2010-09-28 DIAGNOSIS — I10 Essential (primary) hypertension: Secondary | ICD-10-CM

## 2010-09-28 NOTE — Assessment & Plan Note (Signed)
He will continue on Pradaxa. He's currently in sinus rhythm

## 2010-09-28 NOTE — Assessment & Plan Note (Signed)
His blood pressure is elevated today. I would like for him to report blood pressure readings from home. I encouraged him to minimize sodium intake and to try lose weight and I think that that will accomplish adequate blood pressure control. If he is unsuccessful, will add low-dose hydrochlorothiazide. He will be faxing blood pressure readings to [place] over the next 2 weeks.

## 2010-09-28 NOTE — Assessment & Plan Note (Signed)
He has known ischemic heart disease as been relatively asymptomatic. I'll have him see Dr. [place] as well as Lawson Fiscal in approximately 6 months for followup

## 2010-09-28 NOTE — Progress Notes (Signed)
Subjective:   Richard Shannon is seen today for followup visit overall, he's been doing reasonably well but notes his blood pressure slightly elevated today. That has been unusual for him. He's been concerned about his wife's health and weight has always been a difficult issue. I cautioned him about sodium intake.  He has known history of ischemic heart disease. He had remote catheterization in 1993 and had Rotablator to the right coronary artery and angioplasty of the left circumflex. He's had repeat angioplasty of both vessels in 1993 due to restenosis. His last catheterization was in 2004 which showed mild diffuse three-vessel coronary atherosclerosis. His last stress to was in 2009. He has a relatively new onset of chronic atrial fibrillation and has been on Pradaxa although he's been back in sinus rhythm. He continues to do quite well with his exercise tolerance.  He is a 9 mm not he'll in his chest on an abnormal CT scan with a PET scan does suggest a postinfectious inflammatory nodule. His is been stable for the past 6 months and he'll need to have a followup repeat CT scan in another 6-8 months.  Current Outpatient Prescriptions  Medication Sig Dispense Refill  . Ascorbic Acid (VITAMIN C PO) Take 1,000 mg by mouth 2 (two) times daily.        . beta carotene 16109 UNIT capsule Take 25,000 Units by mouth daily.        . dabigatran (PRADAXA) 150 MG CAPS Take 150 mg by mouth every 12 (twelve) hours.        Marland Kitchen diltiazem (CARDIZEM CD) 240 MG 24 hr capsule Take 240 mg by mouth daily.        . enalapril (VASOTEC) 20 MG tablet Take 20 mg by mouth 2 (two) times daily.        . fish oil-omega-3 fatty acids 1000 MG capsule Take 1,200 mg by mouth 2 (two) times daily.        . metoprolol tartrate (LOPRESSOR) 25 MG tablet Take 25 mg by mouth 2 (two) times daily.        . montelukast (SINGULAIR) 10 MG tablet Take 10 mg by mouth at bedtime.        . Multiple Vitamin (MULTIVITAMIN) tablet Take 1 tablet by mouth daily.         Marland Kitchen omeprazole (PRILOSEC OTC) 20 MG tablet Take 20 mg by mouth daily.        . simvastatin (ZOCOR) 80 MG tablet Take 80 mg by mouth at bedtime.          Allergies  Allergen Reactions  . Sulfonamide Derivatives     Patient Active Problem List  Diagnoses  . CORONARY HEART DISEASE  . ATRIAL FIBRILLATION  . LEG PAIN, LEFT  . HYPERSOMNIA WITH SLEEP APNEA UNSPECIFIED  . LUNG NODULE  . PAF (paroxysmal atrial fibrillation)  . Obesity  . Vascular disease  . Hypertension  . IHD (ischemic heart disease)  . WPW (Wolff-Parkinson-White syndrome)    History  Smoking status  . Former Smoker -- 1.0 packs/day for 26 years  . Types: Cigarettes  . Quit date: 10/26/1979  Smokeless tobacco  . Never Used  Comment:      History  Alcohol Use No    Family History  Problem Relation Age of Onset  . Stroke Mother   . Heart attack Father 42    Review of Systems:   The patient denies any heat or cold intolerance.  No weight gain or weight loss.  The  patient denies headaches or blurry vision.  There is no cough or sputum production.  The patient denies dizziness.  There is no hematuria or hematochezia.  The patient denies any muscle aches or arthritis.  The patient denies any rash.  The patient denies frequent falling or instability.  There is no history of depression or anxiety.  All other systems were reviewed and are negative.   Physical Exam:   Weight is 315. Blood pressure is 160/80 sitting, 145/76 standing, heart rate 66. He is morbidly obese.The head is normocephalic and atraumatic.  Pupils are equally round and reactive to light.  Sclerae nonicteric.  Conjunctiva is clear.  Oropharynx is unremarkable.  There's adequate oral airway.  Neck is supple there are no masses.  Thyroid is not enlarged.  There is no lymphadenopathy.  Lungs are clear.  Chest is symmetric.  Heart shows a regular rate and rhythm.  S1 and S2 are normal.  There is no murmur click or gallop.  Abdomen is soft normal bowel  sounds.  There is no organomegaly.  Genital and rectal deferred.  Extremities are without edema.  Peripheral pulses are adequate.  Neurologically intact.  Full range of motion.  The patient is not depressed.  Skin is warm and dry. Assessment / Plan:

## 2010-10-06 NOTE — Op Note (Signed)
Richard Shannon, Richard Shannon                ACCOUNT NO.:  [id number]   MEDICAL RECORD NO.:  [id number]          PATIENT TYPE:  AMB   LOCATION:  DSC                          FACILITY:  [facility]   PHYSICIAN:  Cindee Salt, M.D.       DATE OF BIRTH:  1941-04-22   DATE OF PROCEDURE:  DATE OF DISCHARGE:                               OPERATIVE REPORT   PREOPERATIVE DIAGNOSIS:  Stenosing tenosynovitis, right middle finger.   POSTOPERATIVE DIAGNOSIS:  Stenosing tenosynovitis, right middle finger.   OPERATION:  Release A1 pulley, right middle finger.   SURGEON:  Cindee Salt, M.D.   ASSISTANT:  Carolyne Fiscal RN   ANESTHESIA:  Forearm based IV regional.   ANESTHESIOLOGIST:  Jean Rosenthal.   DATE OF OPERATION:  October 26, 2007.   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  This has not responded  to conservative treatment.  He has elected to undergo decompression of  the A1 pulley, is aware of risks and complications including infection,  recurrence injury to arteries, nerves, tendons, complete relief of  symptoms, dystrophy.  Preoperative area, the patient is seen.  The  extremity is marked by both the patient and surgeon.  Antibiotic given.  Questions again encouraged and answered.   PROCEDURE:  The patient was brought to the operating room, where a  forearm based IV regional anesthetic was carried out without difficulty.  He was prepped using a DuraPrep in supine position, right arm free, and  a time-out was taken.  A longitudinal incision was made in the palm,  carried down through subcutaneous tissue.  The neurovascular structures  were identified and protected.  Retractors placed.  The A1 pulley was  found to be fibrosed.  This was released in its radial aspect.  Small  incision was made in the central aspect of the A2.  Partial  tenosynovectomy was performed of adherent tenosynovium.  Finger placed  through full range motion, no  further triggering was noted.  Wound was  irrigated and skin closed with interrupted 5-0 Vicryl Rapide sutures.  Sterile compressive dressing to the hand was applied.  The patient  tolerated the procedure well and deflation of tourniquet.  All the  fingers remained good and pink.  He was  taken to the recovery room for  observation in satisfactory condition.  He  will be discharged to home,  to return to the [facility] in 1 week, on Vicodin.           ______________________________  Cindee Salt, M.D.    GK/MEDQ  D:  10/26/2007  T:  10/27/2007  Job:  [id number]

## 2010-10-09 NOTE — Op Note (Signed)
[place]  Patient:    GLENNON, KOPKO                    MRN: [id number] Proc. Date: 10/04/00 Adm. Date:  [id number] Attending:  Twana First                           Operative Report  PREOPERATIVE DIAGNOSIS: Right knee medial meniscus tear.  POSTOPERATIVE DIAGNOSES:  1. Right knee medial and lateral meniscal tears.  2. Right knee tricompartmental chondromalacia.  OPERATION/PROCEDURE:  1. Right knee examination under anesthesia followed by arthroscopic partial     medial and lateral meniscectomy.  2. Right knee chondroplasty.  SURGEON: Elana Alm. Thurston Hole, M.D.  ASSISTANT: Kirstin A. Shepperson, P.A.  ANESTHESIA: Local and MAC.  OPERATIVE TIME: Thirty minutes.  COMPLICATIONS: None.  INDICATIONS FOR PROCEDURE: Mr. Ospina is a 70 year old gentleman, who has had significant right knee pain over the past three months to four months, increasing in nature, with signs and symptoms and MRI documenting a medial meniscal tear, who has failed conservative care and is now to undergo arthroscopy.  DESCRIPTION OF PROCEDURE: Mr. Schnelle was brought to the operating room on Oct 04, 2000 after a block had been placed in the holding room.  He was placed on the operating room table in the supine position.  His right knee was examined under anesthesia and there was range of motion from 0-125 degrees, with 1-2+ crepitation, with stable ligamentous examination with normal patella tracking. The right leg was prepped using sterile Betadine and draped using sterile technique.  Originally through an inferolateral portal the arthroscope with a pump attached was placed and through an inferomedial portal an arthroscopic probe was placed.  On initial inspection of the medial compartment the articular cartilage, medial femoral condyle, and medial tibial plateau showed 25-30% grade 3 chondromalacia, which was debrided.  The medial meniscus showed complex tearing  of the portion and medial horn, of which 40-50% was resected back to a stable rim.  The intercondylar notch was inspected and the anterior and posterior cruciate ligaments were normal.  The lateral compartment was inspected and articular cartilage, lateral femoral condyle showed mild grade 1-2 chondromalacia.  The lateral tibial plateau showed 25-30% grade 3 chondromalacia, which was debrided.  The lateral meniscus showed tearing of the lateral horn, of which 25-30% was resected back to a stable rim.  The posterior and anterior horns were intact.  The patellofemoral joint showed grade 3 chondromalacia in both the femoral groove and the patella, 50-60%, which was debrided.  The patella tracked normally.  Moderate synovitis in the medial and lateral gutters was debrided.  Otherwise, they were free of pathology.  After this was done it was felt all pathology had been satisfactorily addressed and instruments were removed.  Portals were closed with 3-0 Nylon suture and injected with 0.25% Marcaine with epinephrine and 4 mg of Morphine.  A sterile dressing was applied and the patient awakened and taken to the recovery room in stable condition.  FOLLOW-UP CARE: Mr. Rosamond will be followed as an outpatient, on Vicodin and Naprosyn, and see me back in the office in a week for suture removal and follow-up. DD:  10/04/00 TD:  10/05/00 Job: [id number]

## 2010-10-09 NOTE — Cardiovascular Report (Signed)
NAMEMAKARI, PORTMAN                            ACCOUNT NO.:  [id number]   MEDICAL RECORD NO.:  [id number]                   PATIENT TYPE:  OIB   LOCATION:  2857                                 FACILITY:  [facility]   PHYSICIAN:  Colleen Can. Deborah Chalk, M.D.            DATE OF BIRTH:  [birth year]   DATE OF PROCEDURE:  01/22/2003  DATE OF DISCHARGE:                              CARDIAC CATHETERIZATION   REFERRING PHYSICIAN:  [facility]   PROCEDURE:  Left heart catheterization with selective coronary angiography,  left ventricular angiography.   TYPE AND SITE OF ENTRY:  Percutaneous, right femoral artery with Perclose   CATHETERS:  6-French 4 curved Judkins right and left coronary catheters; 6-  French pigtail ventriculographic catheter.   CONTRAST MATERIAL:  Omnipaque.   MEDICATIONS GIVEN PRIOR TO THE PROCEDURE:  Valium 10 mg p.o.   MEDICATIONS GIVEN DURING THE PROCEDURE:  Versed 2 mg IV.   COMMENTS:  The patient tolerated the procedure well.   HEMODYNAMIC DATA:  1. The aortic pressure was 146/80.  2. LV was 150/12-18.  3. There was no aortic valve gradient noted on pullback.   ANGIOGRAPHIC DATA:  1. Right coronary artery is a large, dominant vessel.  After the acute     marginal of the heart, but before the crux, there is a 40-50% somewhat     discreet narrowing.  There are scattered irregularities distally.  There     is no focal obstructive disease.  2. Left main coronary artery is normal.  3. Left anterior descending is a moderate vessel.  In the mid portion there     is a 30-40% narrowing.  Otherwise, the vessel is free of significant     obstructive disease.  4. Left circumflex continues as two obtuse marginal branches.  There are     irregularities in these branches, but no significant focal disease.   LEFT VENTRICULOGRAM:  Left ventricular angiogram was performed in the RAO  position.  Overall cardiac size and silhouette are normal.  There was a  discreet area of  inferior basilar hypokinesia.  The global ejection fraction  would be estimated to be 55-60%.  There is no mitral regurgitation,  intracardiac calcification, or intracavitary filling defect.   OVERALL IMPRESSION:  1. Discreet inferior basilar hypokinesis.  2. Mild diffuse three vessel coronary atherosclerosis with minimal     atherosclerosis in the left circumflex, 30% mid left anterior descending,     and 40-50% distal right coronary artery.   DISCUSSION:  In light of these findings, we will continue to have aggressive  management of cardiovascular risk factors.                                               Colleen Can. Deborah Chalk, M.D.  SNT/MEDQ  D:  01/22/2003  T:  01/22/2003  Job:  [id number]   cc:   [facility]

## 2010-10-09 NOTE — Op Note (Signed)
NAME:  Richard Shannon, Richard Shannon NO.:  [id number]   MEDICAL RECORD NO.:  [id number]          PATIENT TYPE:  AMB   LOCATION:  ENDO                         FACILITY:  [facility]   PHYSICIAN:  Danise Edge, M.D.   DATE OF BIRTH:  Sep 16, 1940   DATE OF PROCEDURE:  06/29/2005  DATE OF DISCHARGE:                                 OPERATIVE REPORT   PROCEDURE:  Screening colonoscopy.   REFERRING PHYSICIAN:  Dr. Merlene Laughter.   INDICATIONS:  Mr. [name] is a 69 year old male born March 06, 1941.  Mr. [name] is scheduled to undergo his first screening colonoscopy with  polypectomy to prevent colon cancer.   ENDOSCOPIST:  Danise Edge, M.D.   PREMEDICATION:  Demerol 80 mg, Versed 7.5 mg.   DESCRIPTION OF PROCEDURE:  After obtaining informed consent, Mr. [name] was  placed in the left lateral decubitus position. I administered intravenous  Demerol and intravenous Versed to achieve conscious sedation for the  procedure. The patient's blood pressure, oxygen saturation and cardiac  rhythm were monitored throughout the procedure and documented in the medical  record.   Anal inspection and digital rectal exam were normal. The Olympus adjustable  pediatric colonoscope was introduced into the rectum and advanced to the  cecum. A normal-appearing ileocecal valve and appendiceal orifice were  identified. Colonic preparation for the exam today was excellent.   RECTUM:  Normal. Retroflexed view of the distal rectum normal.  SIGMOID COLON AND DESCENDING COLON:  Left colonic diverticulosis without  diverticulitis or diverticular stricture formation.  SPLENIC FLEXURE:  Normal.  TRANSVERSE COLON:  Normal.  HEPATIC FLEXURE:  Normal.  ASCENDING COLON:  Normal.  CECUM AND ILEOCECAL VALVE:  Normal.   ASSESSMENT:  Normal screening proctocolonoscopy to the cecum. No endoscopic  evidence for the presence of colorectal neoplasia. Left colonic  diverticulosis present.     ______________________________  Danise Edge, M.D.     MJ/MEDQ  D:  06/29/2005  T:  06/29/2005  Job:  [id number]   cc:   Hal T. Stoneking, M.D.  Fax: [phone]

## 2010-10-09 NOTE — H&P (Signed)
NAMETABARI, VOLKERT                            ACCOUNT NO.:  [id number]   MEDICAL RECORD NO.:  [id number]                   PATIENT TYPE:  OIB   LOCATION:  2857                                 FACILITY:  [facility]   PHYSICIAN:  Colleen Can. Deborah Chalk, M.D.            DATE OF BIRTH:  Nov 26, 1940   DATE OF ADMISSION:  01/22/2003  DATE OF DISCHARGE:                                HISTORY & PHYSICAL   PRIMARY COMPLAINT:  None.   HISTORY OF PRESENT ILLNESS:  Mr. Sisler is a 70 year old male who has known  coronary disease.  He presents for elective catheterization after having an  abnormal Cardiolite study.  He underwent stress Cardiolite earlier this  month which demonstrated very good exercise tolerance.  Blood pressure  response was adequate.  EKG was negative.  There was abnormal inferior  perfusion with mild redistribution with normal ejection fraction and  regional wall motion.  In light of these findings, it was felt that he  needed to undergo repeat cardiac catheterization.  Clinically he had done  well without complaints of chest pain.   PAST MEDICAL HISTORY:  1. Atherosclerotic cardiovascular disease.  He has had previous angioplasty     to the right coronary multiple times, originally in August of 1993,     October of 1993, December of 1993, January of 1994, and then again in May     of 1994.  Also, he has had angioplasty to the left circumflex in August     of 1993, October of 1993, December of 1993, and May of 1994.  2. History of WPW, status post ablation in 1996.  3. Obesity.  4. Hypertension.  5. Hyperlipidemia.   ALLERGIES:  SULFA.   CURRENT MEDICATIONS:  1. Lipitor 40 mg daily.  2. Norvasc 10 mg daily.  3. Vasotec 20 mg b.i.d.  4. Vitamin C daily.  5. Beta carotene daily.  6. Vitamin E daily.  7. Aspirin daily.   FAMILY HISTORY:  His father died at 62 with sudden MI.  His mother had no  significant coronary disease.   SOCIAL HISTORY:  He has no current tobacco use.   He practices in tax law.   REVIEW OF SYSTEMS:  Basically unremarkable and otherwise negative.  He has  had no recurrence of chest pain.  No shortness of breath.  He has been  exercising and dieting with success.   PHYSICAL EXAMINATION:  GENERAL APPEARANCE:  He is a pleasant, jovial, white  male in no acute distress.  WEIGHT:  281 pounds.  VITAL SIGNS:  Blood pressure 130/80 sitting and 130/90 standing, heart rate  64, respirations 18, and he is afebrile.  SKIN:  Warm and dry.  Color is unremarkable.  LUNGS:  Clear.  HEART:  Regular rhythm.  ABDOMEN:  Obese.  EXTREMITIES:  He has no peripheral edema.  NEUROLOGIC:  Intact.   LABORATORY DATA:  Pending.  OVERALL IMPRESSION:  1. Abnormal Cardiolite study.  2. Known coronary disease with previous revascularization to both the right     coronary and left circumflex.  3. Obesity.  4. Hypertension.  5. Hyperlipidemia.   PLAN:  Will proceed on with repeat cardiac catheterization.  The procedure  has been reviewed in full detail and he is willing to proceed on January 22, 2003.      Juanell Fairly C. Earl Gala, N.P.                 Colleen Can. Deborah Chalk, M.D.    LCO/MEDQ  D:  01/22/2003  T:  01/22/2003  Job:  [id number]   cc:   Hal T. Stoneking, M.D.  [address] [place], [place] [address]  Fax: [phone]

## 2010-10-27 ENCOUNTER — Telehealth: Payer: Self-pay | Admitting: *Deleted

## 2010-10-27 MED ORDER — HYDROCHLOROTHIAZIDE 25 MG PO TABS: 25.0000 mg | ORAL_TABLET | Freq: Every day | ORAL | Status: DC

## 2010-10-27 NOTE — Telephone Encounter (Signed)
Pt faxed over recent BP readings from home.  BP staying in the 160's/70's range.  Dr. Deborah Chalk notified and instructed RN to have pt try HCTZ 25 mg daily and come back to see Norma Fredrickson NP in two weeks with BMET.  Pt notified of Dr. Ronnald Nian instructions and will see Lawson Fiscal on 11/11/10 with BMET.

## 2010-10-31 ENCOUNTER — Other Ambulatory Visit: Payer: Self-pay | Admitting: Cardiology

## 2010-11-02 ENCOUNTER — Other Ambulatory Visit: Payer: Self-pay | Admitting: *Deleted

## 2010-11-02 MED ORDER — ENALAPRIL MALEATE 20 MG PO TABS: 20.0000 mg | ORAL_TABLET | Freq: Two times a day (BID) | ORAL | Status: DC

## 2010-11-02 NOTE — Telephone Encounter (Signed)
Fax received from pharmacy. Refill completed. Jodette Jaivion Kingsley RN  

## 2010-11-10 ENCOUNTER — Other Ambulatory Visit: Payer: Self-pay | Admitting: *Deleted

## 2010-11-10 ENCOUNTER — Ambulatory Visit: Payer: Medicare Other | Admitting: Nurse Practitioner

## 2010-11-10 DIAGNOSIS — Z79899 Other long term (current) drug therapy: Secondary | ICD-10-CM

## 2010-11-11 ENCOUNTER — Telehealth: Payer: Self-pay | Admitting: *Deleted

## 2010-11-11 ENCOUNTER — Ambulatory Visit (INDEPENDENT_AMBULATORY_CARE_PROVIDER_SITE_OTHER): Payer: Medicare Other | Admitting: Nurse Practitioner

## 2010-11-11 ENCOUNTER — Other Ambulatory Visit (INDEPENDENT_AMBULATORY_CARE_PROVIDER_SITE_OTHER): Payer: Medicare Other | Admitting: *Deleted

## 2010-11-11 ENCOUNTER — Encounter: Payer: Self-pay | Admitting: Nurse Practitioner

## 2010-11-11 VITALS — BP 142/86 | HR 60 | Ht 68.5 in | Wt 314.0 lb

## 2010-11-11 DIAGNOSIS — I4891 Unspecified atrial fibrillation: Secondary | ICD-10-CM

## 2010-11-11 DIAGNOSIS — I2510 Atherosclerotic heart disease of native coronary artery without angina pectoris: Secondary | ICD-10-CM

## 2010-11-11 DIAGNOSIS — Z79899 Other long term (current) drug therapy: Secondary | ICD-10-CM

## 2010-11-11 DIAGNOSIS — I10 Essential (primary) hypertension: Secondary | ICD-10-CM

## 2010-11-11 LAB — BASIC METABOLIC PANEL
BUN: 23 mg/dL (ref 6–23)
CO2: 28 mEq/L (ref 19–32)
Calcium: 9.1 mg/dL (ref 8.4–10.5)
Chloride: 103 mEq/L (ref 96–112)
Creatinine, Ser: 0.8 mg/dL (ref 0.4–1.5)
GFR: 109.25 mL/min (ref >60.00)
Glucose, Bld: 119 mg/dL — ABNORMAL HIGH (ref 70–99)
Potassium: 4.5 mEq/L (ref 3.5–5.1)
Sodium: 136 mEq/L (ref 135–145)

## 2010-11-11 NOTE — Patient Instructions (Signed)
Continue with your current medicines. Monitor your blood pressure at home.  Record your readings and bring to your next visit. Limit sodium intake. Continue with your efforts at losing weight We will get you an appointment with Dr. Delena Serve at [facility] to discuss possible atrial fib ablation Call for any problems.]

## 2010-11-11 NOTE — Assessment & Plan Note (Signed)
He is in sinus today. He would like to consider an ablation and wants to see Dr. Delena Serve. We will make a referral. I have left him on his current medicines.

## 2010-11-11 NOTE — Telephone Encounter (Signed)
Message copied by Adolphus Birchwood on Wed Nov 11, 2010  1:41 PM ------      Message from: Rosalio Macadamia      Created: Wed Nov 11, 2010  1:22 PM       Ok to report. Labs are satisfactory.

## 2010-11-11 NOTE — Telephone Encounter (Signed)
Left message for pt with lab results and for pt to call back with any concerns.

## 2010-11-11 NOTE — Assessment & Plan Note (Signed)
Blood pressure looks better and is better at home. We will keep him on his current regimen. BMET is checked today. I will have him see Dr. Swaziland in 3 months for follow up. Patient is agreeable to this plan and will call if any problems develop in the interim.

## 2010-11-11 NOTE — Progress Notes (Signed)
Richard Shannon Date of Birth: 07/14/1940   History of Present Illness: Richard Shannon is seen back today for a follow up visit. He is seen for Dr. Swaziland. He is a former patient of Dr. Deborah Chalk. He has had some trouble with his blood pressures. He is now on HCTZ and has had improvement in his readings. He feels good overall. No chest pain. Not short of breath. He remains under a great deal of stress with his wife's ovarian cancer. He remains on chronic anticoagulation with Pradaxa for his PAF. He would like an appointment to see Dr. Ronn Melena for consideration of an atrial fib ablation. He just wants to see if ablation is an option for him. He is walking on his treadmill. He is cutting back on his salt. He continues to struggle with his weight.   Current Outpatient Prescriptions on File Prior to Visit  Medication Sig Dispense Refill  . Ascorbic Acid (VITAMIN C PO) Take 1,000 mg by mouth 2 (two) times daily.        . beta carotene 16109 UNIT capsule Take 25,000 Units by mouth daily.        . dabigatran (PRADAXA) 150 MG CAPS Take 150 mg by mouth every 12 (twelve) hours.        Marland Kitchen diltiazem (CARDIZEM CD) 240 MG 24 hr capsule Take 240 mg by mouth daily.        . enalapril (VASOTEC) 20 MG tablet Take 1 tablet (20 mg total) by mouth 2 (two) times daily.  180 tablet  0  . fish oil-omega-3 fatty acids 1000 MG capsule Take 1,200 mg by mouth 2 (two) times daily.        . hydrochlorothiazide 25 MG tablet Take 1 tablet (25 mg total) by mouth daily.  30 tablet  3  . metoprolol tartrate (LOPRESSOR) 25 MG tablet Take 25 mg by mouth 2 (two) times daily.        . montelukast (SINGULAIR) 10 MG tablet Take 10 mg by mouth at bedtime.        . Multiple Vitamin (MULTIVITAMIN) tablet Take 1 tablet by mouth daily.        Marland Kitchen omeprazole (PRILOSEC OTC) 20 MG tablet Take 20 mg by mouth daily.        . simvastatin (ZOCOR) 80 MG tablet Take 80 mg by mouth at bedtime.          Allergies  Allergen Reactions  . Sulfonamide  Derivatives     Past Medical History  Diagnosis Date  . PAF (paroxysmal atrial fibrillation)   . Obesity     MORBID  . Vascular disease   . Hypertension   . IHD (ischemic heart disease)   . WPW (Wolff-Parkinson-White syndrome) 1996    WITH ABLATION AT THE [facility]   . Chronic anticoagulation     Past Surgical History  Procedure Date  . Cardiac catheterization 1993  . Coronary angioplasty     LEFT CIRCUMFLEX  . Coronary angioplasty 1993    REPEAT DUE TO RESTENOSIS  . Cardiac catheterization 2004    SHOWED MILD DIFFUSE THREE VESSEL DISEASE    History  Smoking status  . Former Smoker -- 1.0 packs/day for 26 years  . Types: Cigarettes  . Quit date: 10/26/1979  Smokeless tobacco  . Never Used  Comment:      History  Alcohol Use No    Family History  Problem Relation Age of Onset  . Stroke Mother   . Heart attack  Father 59    Review of Systems: The review of systems is positive for situational stress.  All other systems were reviewed and are negative.  Physical Exam: BP 142/86  Pulse 60  Ht 5' 8.5" (1.74 m)  Wt 314 lb (142.429 kg)  BMI 47.05 kg/m2 Patient is very pleasant and in no acute distress. He is morbidly obese. Skin is warm and dry. Color is normal.  HEENT is unremarkable. Normocephalic/atraumatic. PERRL. Sclera are nonicteric. Neck is supple. No masses. No JVD. Lungs are clear. Cardiac exam shows a regular rate and rhythm today. Heart tones are distant. Abdomen is soft and obese. Extremities are without edema. Gait and ROM are intact. No gross neurologic deficits noted.  LABORATORY DATA:  BMET is pending  Assessment / Plan:

## 2010-11-11 NOTE — Assessment & Plan Note (Signed)
He remains asymptomatic. We will keep him on his current regimen.

## 2010-11-17 ENCOUNTER — Telehealth: Payer: Self-pay | Admitting: *Deleted

## 2010-11-17 NOTE — Progress Notes (Signed)
Pt has an appointment with Dr. Delena Serve on 02/23/11 .  Pt is aware of appointment.

## 2010-11-17 NOTE — Telephone Encounter (Signed)
Left message for pt to make sure he had received the appointment with Dr. Ronn Melena to discuss A. Fib Ablation for 02/23/11 at 130 pm.

## 2010-11-18 ENCOUNTER — Other Ambulatory Visit: Payer: Self-pay | Admitting: *Deleted

## 2010-11-18 MED ORDER — HYDROCHLOROTHIAZIDE 25 MG PO TABS: 25.0000 mg | ORAL_TABLET | Freq: Every day | ORAL | Status: DC

## 2010-11-18 NOTE — Telephone Encounter (Signed)
Pt requesting via fax 90 day supply on HCTZ.  RN called in HCTZ 25 mg one po daily #90 with two refills to [facility] on [address].

## 2010-12-07 ENCOUNTER — Other Ambulatory Visit: Payer: Self-pay

## 2010-12-08 ENCOUNTER — Other Ambulatory Visit: Payer: Medicare Other

## 2010-12-23 ENCOUNTER — Encounter: Payer: Self-pay | Admitting: *Deleted

## 2010-12-31 ENCOUNTER — Ambulatory Visit
Admission: RE | Admit: 2010-12-31 | Discharge: 2010-12-31 | Disposition: A | Discharge status: Home / Self Care | Payer: Medicare Other | Source: Ambulatory Visit | Attending: Cardiology | Admitting: Cardiology

## 2010-12-31 DIAGNOSIS — R911 Solitary pulmonary nodule: Secondary | ICD-10-CM

## 2011-01-11 ENCOUNTER — Telehealth: Payer: Self-pay | Admitting: *Deleted

## 2011-01-11 NOTE — Telephone Encounter (Signed)
Lm w/CT scan results. To see Lawson Fiscal on 8/23. Advised will need to get CT scan repeated in 1 yr.

## 2011-01-11 NOTE — Telephone Encounter (Signed)
Message copied by Lorayne Bender on Mon Jan 11, 2011 11:34 AM ------      Message from: [place], PETER M      Created: Sun Jan 10, 2011 11:09 AM       Stable CT. Lung nodule unchanged. Recommend repeat scan in one year.      Theron Arista [place]

## 2011-01-14 ENCOUNTER — Encounter: Payer: Self-pay | Admitting: Nurse Practitioner

## 2011-01-14 ENCOUNTER — Ambulatory Visit (INDEPENDENT_AMBULATORY_CARE_PROVIDER_SITE_OTHER): Payer: Medicare Other | Admitting: Nurse Practitioner

## 2011-01-14 VITALS — BP 142/74 | HR 64 | Ht 69.0 in | Wt 312.0 lb

## 2011-01-14 DIAGNOSIS — E669 Obesity, unspecified: Secondary | ICD-10-CM

## 2011-01-14 DIAGNOSIS — I4891 Unspecified atrial fibrillation: Secondary | ICD-10-CM

## 2011-01-14 DIAGNOSIS — I480 Paroxysmal atrial fibrillation: Secondary | ICD-10-CM

## 2011-01-14 DIAGNOSIS — Z7901 Long term (current) use of anticoagulants: Secondary | ICD-10-CM

## 2011-01-14 NOTE — Assessment & Plan Note (Signed)
Exercise and weight loss is encouraged.

## 2011-01-14 NOTE — Patient Instructions (Addendum)
Stay on your current medicines I would advise you to stay on your Pradaxa Keep your appointment with Dr. Delena Serve Keep your appointment with Dr. Swaziland next month

## 2011-01-14 NOTE — Assessment & Plan Note (Signed)
He remains on chronic anticoagulation with Pradaxa. I think this is a good option with a CHADS of probably 2. He is going to see Dr. Delena Serve in October. No other changes. He will see Dr. Swaziland next month to further establish his cardiology care. Patient is agreeable to this plan and will call if any problems develop in the interim.

## 2011-01-14 NOTE — Progress Notes (Signed)
Richard Shannon Date of Birth: 1940-09-09   History of Present Illness: Richard Shannon is seen back today for a visit today. He is seen for Dr. Swaziland. He is a former patient of Dr. Ronnald Nian. He is here to discuss his Pradaxa. He has PAF. No recent episodes. He is going to go see Dr. Delena Serve per his request in October to discuss if he could have an ablation. His CHAD's score is 2 (HTN & probable diabetes). He always has glucose of about 120 when checked. No chest pain. He continues to exercise daily. He notes that he can't get his heart rate over 90.   Current Outpatient Prescriptions on File Prior to Visit  Medication Sig Dispense Refill  . Ascorbic Acid (VITAMIN C PO) Take 1,000 mg by mouth 2 (two) times daily.        . beta carotene 16109 UNIT capsule Take 25,000 Units by mouth daily.        . dabigatran (PRADAXA) 150 MG CAPS Take 150 mg by mouth every 12 (twelve) hours.        Marland Kitchen diltiazem (CARDIZEM CD) 240 MG 24 hr capsule Take 240 mg by mouth daily.        . enalapril (VASOTEC) 20 MG tablet Take 1 tablet (20 mg total) by mouth 2 (two) times daily.  180 tablet  0  . fish oil-omega-3 fatty acids 1000 MG capsule Take 1,200 mg by mouth 2 (two) times daily.        . hydrochlorothiazide 25 MG tablet Take 1 tablet (25 mg total) by mouth daily.  30 tablet  5  . metoprolol tartrate (LOPRESSOR) 25 MG tablet Take 25 mg by mouth 2 (two) times daily.        . montelukast (SINGULAIR) 10 MG tablet Take 10 mg by mouth at bedtime.        . Multiple Vitamin (MULTIVITAMIN) tablet Take 1 tablet by mouth daily.        Marland Kitchen omeprazole (PRILOSEC OTC) 20 MG tablet Take 20 mg by mouth daily.        . simvastatin (ZOCOR) 80 MG tablet Take 80 mg by mouth at bedtime.          Allergies  Allergen Reactions  . Sulfonamide Derivatives     Past Medical History  Diagnosis Date  . PAF (paroxysmal atrial fibrillation)   . Obesity   . Hypertension   . IHD (ischemic heart disease)     Remote cath in 1993 with rotablator  the RCA and PCI to LCX. Repeat PCI to both vessels in 1993 due to restenosis. Last  cath in 2004 showing mild diffuse 3VD.   . WPW (Wolff-Parkinson-White syndrome) 1996    WITH ABLATION AT THE [facility]   . Chronic anticoagulation     on Pradaxa  . Glucose intolerance (impaired glucose tolerance)   . Normal nuclear stress test March 2012  . Abnormal chest CT     Chronic; checked yearly.     Past Surgical History  Procedure Date  . Cardiac catheterization 1993  . Coronary angioplasty     LEFT CIRCUMFLEX  . Coronary angioplasty 1993    REPEAT DUE TO RESTENOSIS  . Cardiac catheterization 2004    SHOWED MILD DIFFUSE THREE VESSEL DISEASE    History  Smoking status  . Former Smoker -- 1.0 packs/day for 26 years  . Types: Cigarettes  . Quit date: 10/26/1979  Smokeless tobacco  . Never Used  Comment:  History  Alcohol Use No    Family History  Problem Relation Age of Onset  . Stroke Mother   . Heart attack Father 74    Review of Systems: The review of systems is positive for very mild bruising.  All other systems were reviewed and are negative.  Physical Exam: BP 142/74  Pulse 64  Ht 5\' 9"  (1.753 m)  Wt 312 lb (141.522 kg)  BMI 46.07 kg/m2 Patient is very pleasant and in no acute distress. Skin is warm and dry. Color is normal.  HEENT is unremarkable. Normocephalic/atraumatic. PERRL. Sclera are nonicteric. Neck is supple. No masses. No JVD. Lungs are clear. Cardiac exam shows a regular rate and rhythm today. Abdomen is obese but soft. Extremities are without edema. Gait and ROM are intact. No gross neurologic deficits noted.   LABORATORY DATA:   Assessment / Plan:

## 2011-01-15 ENCOUNTER — Telehealth: Payer: Self-pay | Admitting: Cardiology

## 2011-01-15 NOTE — Telephone Encounter (Signed)
Pt has questions about referral to an electrocardiologist to [facility] [place] [place].  Pt would like to put that on hold due to opportunity to go to [place].  Pt needs the contact phone # to cancel for now.  Please call pt on his cell and states it is fine to leave a message if he doesn't answer.

## 2011-01-15 NOTE — Telephone Encounter (Signed)
Called wanting to know to the EP's phone number in [place] that he was going to see. Per Richard Shannon is scheduler for Dr. Ronn Melena. Phone number is [phone]. States he has an app in Oct and they have a chance to go to [place] and was wanting to move app. He will call them.

## 2011-01-28 ENCOUNTER — Other Ambulatory Visit: Payer: Self-pay | Admitting: Cardiology

## 2011-01-28 NOTE — Telephone Encounter (Signed)
Patient was referred by Dr.[name] to be evaluated at the [facility] in [place], Dr. Ronn Melena.  His office needs the Ablation results from the one Dr.[name] ordered done at [facility].   Fax to [phone] attn: Roddie Mc Call patient back to let him know when done.

## 2011-01-29 ENCOUNTER — Other Ambulatory Visit: Payer: Self-pay | Admitting: *Deleted

## 2011-01-29 MED ORDER — ENALAPRIL MALEATE 20 MG PO TABS: 20.0000 mg | ORAL_TABLET | Freq: Two times a day (BID) | ORAL | Status: AC

## 2011-01-29 NOTE — Telephone Encounter (Signed)
Fax received from pharmacy. Refill completed. Pt called, faxed ablation notes to [facility] of [place] Dr m Carter Kitten RN

## 2011-01-29 NOTE — Telephone Encounter (Signed)
Done/ pt called back/ record sent

## 2011-02-09 ENCOUNTER — Other Ambulatory Visit: Payer: Self-pay | Admitting: Cardiology

## 2011-02-09 NOTE — Telephone Encounter (Signed)
Refilled Meds from fax  

## 2011-02-10 ENCOUNTER — Ambulatory Visit (INDEPENDENT_AMBULATORY_CARE_PROVIDER_SITE_OTHER): Payer: Medicare Other | Admitting: Cardiology

## 2011-02-10 ENCOUNTER — Encounter: Payer: Self-pay | Admitting: Cardiology

## 2011-02-10 VITALS — BP 130/70 | HR 68 | Ht 69.0 in | Wt 315.4 lb

## 2011-02-10 DIAGNOSIS — J984 Other disorders of lung: Secondary | ICD-10-CM

## 2011-02-10 DIAGNOSIS — I10 Essential (primary) hypertension: Secondary | ICD-10-CM

## 2011-02-10 DIAGNOSIS — I456 Pre-excitation syndrome: Secondary | ICD-10-CM

## 2011-02-10 DIAGNOSIS — I2510 Atherosclerotic heart disease of native coronary artery without angina pectoris: Secondary | ICD-10-CM

## 2011-02-10 DIAGNOSIS — I480 Paroxysmal atrial fibrillation: Secondary | ICD-10-CM

## 2011-02-10 DIAGNOSIS — I4891 Unspecified atrial fibrillation: Secondary | ICD-10-CM

## 2011-02-10 NOTE — Patient Instructions (Signed)
Continue your current medications.  Continue your exercise and weight loss.  I will see you again in 6 months.

## 2011-02-10 NOTE — Progress Notes (Signed)
Richard Shannon [name] Date of Birth: [birth year]   History of Present Illness: Richard Shannon is seen for the first time to establish cardiac care. He is a former patient of Dr. Deborah Chalk. He has a history of atrial fibrillation. He has a history of coronary disease. He has had remote to catheter ablation for WPW in the past. His coronary disease is significant for previous Rotablator to the right coronary and angioplasty of the left circumflex coronary 1993. He had repeat angioplasty for both these lesions for restenosis. Cardiac catheterization in 2004 showed mild diffuse 3 vessel disease. He has been managed medically since then. His last nuclear stress test in March of this year showed a low risk study with inferior infarct versus diaphragmatic attenuation. There is no other ischemia. He does have a prior history of atrial fibrillation and underwent cardioversion in April of 2011. He has had no documented atrial fibrillation since that time but thinks that he may have been out of rhythm twice in the past 6 months lasting a few hours. He has minimal symptoms associated with this. He feels that he is in normal rhythm 99% of the time. He does go to the gym daily and has been trying to lose weight. He has lost 4 pounds since his last visit. He previously had scheduled a visit to see Dr. Ronn Melena for consideration of an atrial fibrillation ablation. He is on chronic anticoagulation. It is noteworthy that he started CPAP therapy one year ago for obstructive sleep apnea.  Current Outpatient Prescriptions on File Prior to Visit  Medication Sig Dispense Refill  . Ascorbic Acid (VITAMIN C PO) Take 1,000 mg by mouth 2 (two) times daily.        . beta carotene 30865 UNIT capsule Take 25,000 Units by mouth daily.        Marland Kitchen diltiazem (CARDIZEM CD) 240 MG 24 hr capsule Take 240 mg by mouth daily.        . enalapril (VASOTEC) 20 MG tablet Take 1 tablet (20 mg total) by mouth 2 (two) times daily.  180 tablet  1  . fish  oil-omega-3 fatty acids 1000 MG capsule Take 1,200 mg by mouth 2 (two) times daily.        . hydrochlorothiazide 25 MG tablet Take 1 tablet (25 mg total) by mouth daily.  30 tablet  5  . metoprolol tartrate (LOPRESSOR) 25 MG tablet Take 25 mg by mouth 2 (two) times daily.        . montelukast (SINGULAIR) 10 MG tablet Take 10 mg by mouth at bedtime.        . Multiple Vitamin (MULTIVITAMIN) tablet Take 1 tablet by mouth daily.        Marland Kitchen omeprazole (PRILOSEC OTC) 20 MG tablet Take 20 mg by mouth daily.        Marland Kitchen PRADAXA 150 MG CAPS TAKE 1 CAPSULE BY MOUTH 2 TIMES A DAY  180 capsule  3  . simvastatin (ZOCOR) 80 MG tablet Take 80 mg by mouth at bedtime.          Allergies  Allergen Reactions  . Sulfonamide Derivatives     Past Medical History  Diagnosis Date  . PAF (paroxysmal atrial fibrillation)   . Obesity   . Hypertension   . IHD (ischemic heart disease)     Remote cath in 1993 with rotablator the RCA and PCI to LCX. Repeat PCI to both vessels in 1993 due to restenosis. Last  cath in 2004 showing mild  diffuse 3VD.   . WPW (Wolff-Parkinson-White syndrome) 1996    WITH ABLATION AT [facility]   . Chronic anticoagulation     on Pradaxa  . Glucose intolerance (impaired glucose tolerance)   . Normal nuclear stress test March 2012  . Abnormal chest CT     Chronic; checked yearly.   . Sleep apnea, obstructive     on CPAP    Past Surgical History  Procedure Date  . Cardiac catheterization 1993  . Coronary angioplasty     LEFT CIRCUMFLEX  . Coronary angioplasty 1993    REPEAT DUE TO RESTENOSIS  . Cardiac catheterization 2004    SHOWED MILD DIFFUSE THREE VESSEL DISEASE  . Tonsillectomy     History  Smoking status  . Former Smoker -- 1.0 packs/day for 26 years  . Types: Cigarettes  . Quit date: 10/26/1979  Smokeless tobacco  . Never Used  Comment:      History  Alcohol Use No    Family History  Problem Relation Age of Onset  . Stroke Mother   . Heart attack Father 71      Review of Systems: The review of systems is positive for mild palpitations. He does have a history of a lung nodule with a negative PET scan in the past. CTs of the chest that showed no change. He does have a history of obstructive sleep apnea and since he has been on CPAP therapy he has noticed a marked improvement in his energy level and breathing. All other systems were reviewed and are negative.  Physical Exam: BP 130/70  Pulse 68  Ht 5\' 9"  (1.753 m)  Wt 315 lb 6.4 oz (143.065 kg)  BMI 46.58 kg/m2 He is a morbidly obese white male in no acute distress. He is normocephalic, atraumatic. Pupils equal round and reactive to light and accommodation. Sclera are nonicteric. Neck is supple without JVD, adenopathy, thyromegaly, or bruits. Lungs are clear. Cardiac exam reveals a regular rate and rhythm without gallop, murmur, or click. Abdomen is morbidly obese, soft, nontender. He has no edema. Skin is warm and dry. Pedal pulses are good. He is alert oriented x3. Cranial nerves II through XII are intact. LABORATORY DATA:   Assessment / Plan:

## 2011-02-10 NOTE — Assessment & Plan Note (Signed)
No significant anginal symptoms. His last Cardiolite study showed no significant ischemia. We'll continue with his current medical therapy.

## 2011-02-10 NOTE — Assessment & Plan Note (Signed)
Recent CT showed no change in his pulmonary nodule. Would continue surveillance for 6-12 months.

## 2011-02-10 NOTE — Assessment & Plan Note (Signed)
Status post remote catheter ablation.

## 2011-02-10 NOTE — Assessment & Plan Note (Signed)
His last cardioversion was in April 2011. He has had minimal recurrence. He has no significant symptomatology. He is on long-term anticoagulation. I really do not see the benefit of an atrial fibrillation ablation at this point and would recommend continued medical therapy.

## 2011-02-18 LAB — POCT HEMOGLOBIN-HEMACUE: Hemoglobin: 15.7

## 2011-03-04 ENCOUNTER — Other Ambulatory Visit: Payer: Self-pay | Admitting: Cardiology

## 2011-03-24 ENCOUNTER — Other Ambulatory Visit: Payer: Self-pay | Admitting: Cardiology

## 2011-04-02 ENCOUNTER — Telehealth: Payer: Self-pay | Admitting: Cardiology

## 2011-04-02 NOTE — Telephone Encounter (Signed)
Pt wants lori to call him about his pradaxa

## 2011-04-02 NOTE — Telephone Encounter (Signed)
Pt specifically wants to talk to Norma Fredrickson and states it can wait until next week.

## 2011-04-05 NOTE — Telephone Encounter (Signed)
Patient is wanting samples of Pradaxa 150mg  if available. Ok to give. Please let him know if we have any and he will pick up today.

## 2011-04-19 ENCOUNTER — Telehealth: Payer: Self-pay | Admitting: Cardiology

## 2011-04-19 ENCOUNTER — Ambulatory Visit (INDEPENDENT_AMBULATORY_CARE_PROVIDER_SITE_OTHER): Payer: Medicare Other | Admitting: Nurse Practitioner

## 2011-04-19 ENCOUNTER — Encounter: Payer: Self-pay | Admitting: Nurse Practitioner

## 2011-04-19 DIAGNOSIS — I2510 Atherosclerotic heart disease of native coronary artery without angina pectoris: Secondary | ICD-10-CM

## 2011-04-19 DIAGNOSIS — I4891 Unspecified atrial fibrillation: Secondary | ICD-10-CM

## 2011-04-19 DIAGNOSIS — R079 Chest pain, unspecified: Secondary | ICD-10-CM | POA: Insufficient documentation

## 2011-04-19 MED ORDER — NITROGLYCERIN 0.4 MG SL SUBL: 0.4000 mg | SUBLINGUAL_TABLET | SUBLINGUAL | Status: DC | PRN

## 2011-04-19 NOTE — Assessment & Plan Note (Signed)
Has known CAD with mild 3VD. Had low risk nuclear study in March with inferior infarct versus diaphragmatic attenuation. We will plan to repeat his cath if symptoms persist.

## 2011-04-19 NOTE — Assessment & Plan Note (Signed)
I have spoken with Dr. Swaziland regarding Richard Shannon care. We will continue to follow him clinically and we will see how he does over the next week or so. If he has recurrence, we will make plans for repeat cardiac catheterization. He may continue with his exercise program. I have sent a prescription for NTG to the drug store. I have reviewed how to use it. Patient is agreeable to this plan and will call if any problems develop in the interim.

## 2011-04-19 NOTE — Telephone Encounter (Signed)
New problem Pt has been having chest tightness over the last couple of weeks and it has been going away. Most recent episode last night please call

## 2011-04-19 NOTE — Progress Notes (Signed)
[name] Date of Birth: 1941/01/30 Medical Record #[id number]  History of Present Illness: Richard Shannon is seen today for a work in visit for chest pain. He is seen for Dr. [place]. He has a history of known CAD with remote PCI. Last cath in 2004 showing mild 3VD. Last stress test was this past March which was felt to be a low risk study with inferior infarct versus diaphragmatic attenuation. Other issues include PAF and morbid obesity. He has been doing well up until about 2 weeks ago. Had just returned about a month ago from a trip to [place]. He contracted a URI at the end of the trip and was sick for about a week after getting back home. He then started back to the gym. He admits that he got way off track with his diet. He gained 6 pounds. He admits to drinking too much alcohol. He got back on track and started back to the gym about 3 weeks ago. He now reports 3 episodes of chest discomfort. The first was 2 weeks ago. He was at the gym walking on the treadmill. He had walked for about 25 minutes when he had the onset of chest pressure. He had no associated symptoms. He finished his walk which was for a total of 30 minutes. It resolved. He continued to exercise daily and had no recurrence until a few days later. He had a second episode that was identical. Onset was after walking about 25 minutes and lasted until he finished his walk. No associated symptoms. His third episode was last night. He was at the movies watching "[place]". He says it was quite intense. Towards the end of the movie he had the recurrence of this chest pressure, again with no associated symptoms. This episode lasted about 25 minutes in duration. He slept fine. Has not had any recurrence. He did not go to the gym today due to a dental appointment. He does not feel like he has had any atrial fib.    Current Outpatient Prescriptions on File Prior to Visit  Medication Sig Dispense Refill  . Ascorbic Acid (VITAMIN C PO) Take 1,000 mg by mouth  2 (two) times daily.        . beta carotene 40981 UNIT capsule Take 25,000 Units by mouth daily.        Marland Kitchen diltiazem (CARDIZEM CD) 240 MG 24 hr capsule TAKE ONE CAPSULE BY MOUTH EVERY DAY  90 capsule  1  . enalapril (VASOTEC) 20 MG tablet Take 1 tablet (20 mg total) by mouth 2 (two) times daily.  180 tablet  1  . fish oil-omega-3 fatty acids 1000 MG capsule Take 1,200 mg by mouth 2 (two) times daily.        . hydrochlorothiazide 25 MG tablet Take 1 tablet (25 mg total) by mouth daily.  30 tablet  5  . metoprolol tartrate (LOPRESSOR) 25 MG tablet Take 25 mg by mouth 2 (two) times daily.        . montelukast (SINGULAIR) 10 MG tablet Take 10 mg by mouth at bedtime.        . Multiple Vitamin (MULTIVITAMIN) tablet Take 1 tablet by mouth daily.        Marland Kitchen omeprazole (PRILOSEC OTC) 20 MG tablet Take 20 mg by mouth daily.        Marland Kitchen PRADAXA 150 MG CAPS TAKE 1 CAPSULE BY MOUTH 2 TIMES A DAY  180 capsule  3  . simvastatin (ZOCOR) 80 MG tablet TAKE 1 TABLET  DAILY  90 tablet  3    Allergies  Allergen Reactions  . Sulfonamide Derivatives     Past Medical History  Diagnosis Date  . PAF (paroxysmal atrial fibrillation)   . Obesity   . Hypertension   . IHD (ischemic heart disease)     Remote cath in 1993 with rotablator the RCA and PCI to LCX. Repeat PCI to both vessels in 1993 due to restenosis. Last  cath in 2004 showing mild diffuse 3VD.   . WPW (Wolff-Parkinson-White syndrome) 1996    WITH ABLATION AT [facility]   . Chronic anticoagulation     on Pradaxa  . Glucose intolerance (impaired glucose tolerance)   . Normal nuclear stress test March 2012  . Abnormal chest CT     Chronic; checked yearly.   . Sleep apnea, obstructive     on CPAP    Past Surgical History  Procedure Date  . Cardiac catheterization 1993  . Coronary angioplasty     LEFT CIRCUMFLEX  . Coronary angioplasty 1993    REPEAT DUE TO RESTENOSIS  . Cardiac catheterization 2004    SHOWED MILD DIFFUSE THREE VESSEL DISEASE  .  Tonsillectomy     History  Smoking status  . Former Smoker -- 1.0 packs/day for 26 years  . Types: Cigarettes  . Quit date: 10/26/1979  Smokeless tobacco  . Never Used  Comment:      History  Alcohol Use No    Family History  Problem Relation Age of Onset  . Stroke Mother   . Heart attack Father 72    Review of Systems: The review of systems is per the HPI. He did have some swelling of his feet during his trip but it has now resolved. No cough, fever or chills. No leg pain. No hemoptysis. No excessive bruising or bleeding with his Pradaxa.  All other systems were reviewed and are negative.  Physical Exam: BP 138/74  Pulse 58  Ht 5' 8.5" (1.74 m)  Wt 320 lb 12.8 oz (145.514 kg)  BMI 48.07 kg/m2 Patient is very pleasant and in no acute distress. He is morbidly obese. Skin is warm and dry. Color is normal.  HEENT is unremarkable. Normocephalic/atraumatic. PERRL. Sclera are nonicteric. Neck is supple. No masses. No JVD. Lungs are clear. Cardiac exam shows a regular rate and rhythm. Abdomen is soft. Extremities are without edema. Gait and ROM are intact. No gross neurologic deficits noted.  LABORATORY DATA: EKG shows sinus bradycardia. No acute changes. Tracing was reviewed with Dr. Swaziland.   Assessment / Plan:

## 2011-04-19 NOTE — Assessment & Plan Note (Signed)
No recurrence of his atrial fib.

## 2011-04-19 NOTE — Patient Instructions (Signed)
We will see you back in 2 weeks to recheck you.   I have sent a prescription for NTG to have on hand. Use your NTG under your tongue for recurrent chest pain. May take one tablet every 5 minutes. If you are still having discomfort after 3 tablets in 15 minutes, call 911.  Let us know if you have recurrent spells. We will then arrange for repeat cardiac catheterization.  Call the [facility] office at [phone] if you have any questions, problems or concerns.

## 2011-04-19 NOTE — Telephone Encounter (Signed)
Cp no nitro used/ none available, pain been off and on for two weeks per wife, very concerned for him. App made for today with Norma Fredrickson NP

## 2011-04-21 MED ORDER — NITROGLYCERIN 0.4 MG SL SUBL: 0.4000 mg | SUBLINGUAL_TABLET | SUBLINGUAL | Status: AC | PRN

## 2011-04-21 NOTE — Progress Notes (Signed)
Addended by: Royanne Foots on: 04/21/2011 10:27 AM   Modules accepted: Orders

## 2011-04-29 ENCOUNTER — Other Ambulatory Visit: Payer: Self-pay

## 2011-04-29 ENCOUNTER — Inpatient Hospital Stay (HOSPITAL_COMMUNITY)
Admission: EM | Admit: 2011-04-29 | Discharge: 2011-05-01 | DRG: 249 | Disposition: A | Discharge status: Home / Self Care | Payer: Medicare Other | Attending: Cardiology | Admitting: Cardiology

## 2011-04-29 ENCOUNTER — Emergency Department (HOSPITAL_COMMUNITY): Payer: Medicare Other

## 2011-04-29 ENCOUNTER — Encounter (HOSPITAL_COMMUNITY): Payer: Self-pay | Admitting: Emergency Medicine

## 2011-04-29 DIAGNOSIS — Z882 Allergy status to sulfonamides status: Secondary | ICD-10-CM

## 2011-04-29 DIAGNOSIS — Z7902 Long term (current) use of antithrombotics/antiplatelets: Secondary | ICD-10-CM

## 2011-04-29 DIAGNOSIS — I214 Non-ST elevation (NSTEMI) myocardial infarction: Secondary | ICD-10-CM | POA: Diagnosis present

## 2011-04-29 DIAGNOSIS — I10 Essential (primary) hypertension: Secondary | ICD-10-CM | POA: Diagnosis present

## 2011-04-29 DIAGNOSIS — I4891 Unspecified atrial fibrillation: Secondary | ICD-10-CM

## 2011-04-29 DIAGNOSIS — Z7901 Long term (current) use of anticoagulants: Secondary | ICD-10-CM

## 2011-04-29 DIAGNOSIS — Z9861 Coronary angioplasty status: Secondary | ICD-10-CM

## 2011-04-29 DIAGNOSIS — E785 Hyperlipidemia, unspecified: Secondary | ICD-10-CM | POA: Diagnosis present

## 2011-04-29 DIAGNOSIS — K219 Gastro-esophageal reflux disease without esophagitis: Secondary | ICD-10-CM | POA: Diagnosis present

## 2011-04-29 DIAGNOSIS — I480 Paroxysmal atrial fibrillation: Secondary | ICD-10-CM | POA: Diagnosis present

## 2011-04-29 DIAGNOSIS — R079 Chest pain, unspecified: Secondary | ICD-10-CM

## 2011-04-29 DIAGNOSIS — G4733 Obstructive sleep apnea (adult) (pediatric): Secondary | ICD-10-CM | POA: Diagnosis present

## 2011-04-29 DIAGNOSIS — Z79899 Other long term (current) drug therapy: Secondary | ICD-10-CM

## 2011-04-29 DIAGNOSIS — R7309 Other abnormal glucose: Secondary | ICD-10-CM | POA: Diagnosis present

## 2011-04-29 DIAGNOSIS — I2510 Atherosclerotic heart disease of native coronary artery without angina pectoris: Secondary | ICD-10-CM | POA: Diagnosis present

## 2011-04-29 DIAGNOSIS — Z87891 Personal history of nicotine dependence: Secondary | ICD-10-CM

## 2011-04-29 LAB — BASIC METABOLIC PANEL
BUN: 23 mg/dL (ref 6–23)
CO2: 27 mEq/L (ref 19–32)
Calcium: 9.7 mg/dL (ref 8.4–10.5)
Creatinine, Ser: 0.83 mg/dL (ref 0.50–1.35)
Glucose, Bld: 139 mg/dL — ABNORMAL HIGH (ref 70–99)

## 2011-04-29 LAB — CBC
HCT: 41.7 % (ref 39.0–52.0)
Hemoglobin: 14.1 g/dL (ref 13.0–17.0)
MCH: 30.5 pg (ref 26.0–34.0)
MCHC: 33.8 g/dL (ref 30.0–36.0)
MCV: 90.1 fL (ref 78.0–100.0)
Platelets: 173 K/uL (ref 150–400)
RBC: 4.63 MIL/uL (ref 4.22–5.81)
RDW: 12.8 % (ref 11.5–15.5)
WBC: 8.2 K/uL (ref 4.0–10.5)

## 2011-04-29 LAB — BASIC METABOLIC PANEL WITH GFR
Chloride: 104 meq/L (ref 96–112)
GFR calc Af Amer: >90 mL/min (ref >90)
GFR calc non Af Amer: 86 mL/min — ABNORMAL LOW (ref >90)
Potassium: 3.9 meq/L (ref 3.5–5.1)
Sodium: 140 meq/L (ref 135–145)

## 2011-04-29 LAB — POCT I-STAT TROPONIN I

## 2011-04-29 LAB — PROTIME-INR
INR: 0.98 (ref 0.00–1.49)
Prothrombin Time: 13.2 s (ref 11.6–15.2)

## 2011-04-29 MED ORDER — HEPARIN SOD (PORCINE) IN D5W 100 UNIT/ML IV SOLN
1400.0000 [IU]/h | INTRAVENOUS | Status: DC
  Filled 2011-04-29: qty 250

## 2011-04-29 MED ORDER — ASPIRIN 81 MG PO CHEW
324.0000 mg | CHEWABLE_TABLET | Freq: Once | ORAL | Status: AC
  Administered 2011-04-29: 324 mg via ORAL
  Filled 2011-04-29: qty 4

## 2011-04-29 NOTE — ED Notes (Signed)
Pt reports having multiple episodes of chest tightness over the past 10 days, denies N/V/SOB/diaphoresis.  Pt noted to be anxious.  Skin warm, dry and intact.  Neuro intact.  Pt non-tender on palpation.

## 2011-04-29 NOTE — Progress Notes (Signed)
PHARMACY - ANTICOAGULATION CONSULT INITIAL NOTE  Pharmacy Consult for: Heparin  Indication: R/o ACS    Patient Data:   Allergies: Allergies  Allergen Reactions  . Sulfonamide Derivatives     Patient Measurements: Height: 5' 8.5" (174 cm) Weight: 320 lb 12.8 oz (145.514 kg) IBW/kg (Calculated) : 69.55  Adjusted Body Weight: 104.6 kg   Vital Signs: Temp:  [98.1 F (36.7 C)] 98.1 F (36.7 C) (12/06 2203) Pulse Rate:  [66-72] 72  (12/06 2253) Resp:  [14-18] 14  (12/06 2253) BP: (154-188)/(76-80) 154/80 mmHg (12/06 2253) SpO2:  [99 %-100 %] 99 % (12/06 2253) Weight:  [320 lb 12.8 oz (145.514 kg)] 320 lb 12.8 oz (145.514 kg) (12/06 2300)  Intake/Output from previous day: No intake or output data in the 24 hours ending 04/29/11 2315  Labs:  [facility] 04/29/11 2211  HGB 14.1  HCT 41.7  PLT 173  APTT --  LABPROT --  INR --  HEPARINUNFRC --  CREATININE 0.83  CKTOTAL --  CKMB --  TROPONINI --   Estimated Creatinine Clearance: 115.5 ml/min (by C-G formula based on Cr of 0.83).  Medical History: Past Medical History  Diagnosis Date  . PAF (paroxysmal atrial fibrillation)   . Obesity   . Hypertension   . IHD (ischemic heart disease)     Remote cath in 1993 with rotablator the RCA and PCI to LCX. Repeat PCI to both vessels in 1993 due to restenosis. Last  cath in 2004 showing mild diffuse 3VD.   . WPW (Wolff-Parkinson-White syndrome) 1996    WITH ABLATION AT [facility]   . Chronic anticoagulation     on Pradaxa  . Glucose intolerance (impaired glucose tolerance)   . Normal nuclear stress test March 2012  . Abnormal chest CT     Chronic; checked yearly.   . Sleep apnea, obstructive     on CPAP    Scheduled medications:     . aspirin  324 mg Oral Once     Assessment:  70 y.o. male admitted on 04/29/2011, with r/o ACS. Pharmacy consulted to manage IV heparin. Patient with h/o atrial fibrillation on dabigatran PTA. Per patient last dose taken at 20:30  tonight. As estimated CrCl > 100 mL/min, dabigatran will provide anticoagulation for 12 hours. Will start IV heparin at 08:30 AM tomorrow at 1400 units/hr.   Goal of Therapy:  1. Heparin level 0.3-0.7 units/ml  Plan:  1.  At 0830 AM on 12/7, start IV heparin infusion at 1400 units/hr.  2. Heparin level at 1630 (8 hours after starting heparin). 3. Daily CBC, heparin level starting 12/8.   Dineen Kid Thad Ranger, PharmD 04/29/2011, 11:15 PM

## 2011-04-29 NOTE — ED Provider Notes (Signed)
History     CSN: [id number] Arrival date & time: 04/29/2011  9:56 PM   First MD Initiated Contact with Patient 04/29/11 2250      Chief Complaint  Patient presents with  . Chest Pain    (Consider location/radiation/quality/duration/timing/severity/associated sxs/prior treatment) Patient is a 70 y.o. male presenting with chest pain. The history is provided by the patient.  Chest Pain The chest pain began 1 - 2 hours ago (2100). Duration of episode(s) is 20 minutes. Chest pain occurs intermittently. The chest pain is resolved. At its most intense, the pain is at 8/10. The pain is currently at 0/10. The quality of the pain is described as similar to previous episodes and tightness. The pain does not radiate. Pertinent negatives for primary symptoms include no fever, no shortness of breath, no cough, no palpitations, no nausea and no vomiting.  Pertinent negatives for associated symptoms include no paroxysmal nocturnal dyspnea. He tried nothing for the symptoms. Risk factors include being elderly, male gender and obesity.  His past medical history is significant for CAD and MI.  Procedure history is positive for cardiac catheterization.     Past Medical History  Diagnosis Date  . PAF (paroxysmal atrial fibrillation)   . Obesity   . Hypertension   . IHD (ischemic heart disease)     Remote cath in 1993 with rotablator the RCA and PCI to LCX. Repeat PCI to both vessels in 1993 due to restenosis. Last  cath in 2004 showing mild diffuse 3VD.   . WPW (Wolff-Parkinson-White syndrome) 1996    WITH ABLATION AT [facility]   . Chronic anticoagulation     on Pradaxa  . Glucose intolerance (impaired glucose tolerance)   . Normal nuclear stress test March 2012  . Abnormal chest CT     Chronic; checked yearly.   . Sleep apnea, obstructive     on CPAP    Past Surgical History  Procedure Date  . Cardiac catheterization 1993  . Coronary angioplasty     LEFT CIRCUMFLEX  . Coronary  angioplasty 1993    REPEAT DUE TO RESTENOSIS  . Cardiac catheterization 2004    SHOWED MILD DIFFUSE THREE VESSEL DISEASE  . Tonsillectomy   . Balloon angioplasty, artery     Family History  Problem Relation Age of Onset  . Stroke Mother   . Heart attack Father 20    History  Substance Use Topics  . Smoking status: Former Smoker -- 1.0 packs/day for 26 years    Types: Cigarettes    Quit date: 10/26/1979  . Smokeless tobacco: Never Used   Comment:    . Alcohol Use: No      Review of Systems  Unable to perform ROS Constitutional: Negative for fever and chills.  Respiratory: Negative for cough and shortness of breath.   Cardiovascular: Positive for chest pain. Negative for palpitations and leg swelling.  Gastrointestinal: Negative for nausea and vomiting.  Musculoskeletal: Negative for myalgias and arthralgias.  Skin: Negative for color change and rash.  Neurological: Negative for light-headedness and headaches.  All other systems reviewed and are negative.    Allergies  Sulfonamide derivatives  Home Medications   Current Outpatient Rx  Name Route Sig Dispense Refill  . VITAMIN C PO Oral Take 1,000 mg by mouth 2 (two) times daily.      Marland Kitchen BETA CAROTENE 14782 UNITS PO CAPS Oral Take 25,000 Units by mouth daily.      Marland Kitchen DILTIAZEM HCL ER COATED BEADS 240 MG  PO CP24  TAKE ONE CAPSULE BY MOUTH EVERY DAY 90 capsule 1  . ENALAPRIL MALEATE 20 MG PO TABS Oral Take 1 tablet (20 mg total) by mouth 2 (two) times daily. 180 tablet 1  . OMEGA-3 FATTY ACIDS 1000 MG PO CAPS Oral Take 1,200 mg by mouth 2 (two) times daily.      Marland Kitchen HYDROCHLOROTHIAZIDE 25 MG PO TABS Oral Take 1 tablet (25 mg total) by mouth daily. 30 tablet 5    All futher refills to Dr. Peter Swaziland (pt's new c ...  . METOPROLOL TARTRATE 25 MG PO TABS Oral Take 25 mg by mouth 2 (two) times daily.      Marland Kitchen MONTELUKAST SODIUM 10 MG PO TABS Oral Take 10 mg by mouth at bedtime.      Marland Kitchen ONE-DAILY MULTI VITAMINS PO TABS Oral  Take 1 tablet by mouth daily.      Marland Kitchen NITROGLYCERIN 0.4 MG SL SUBL Sublingual Place 1 tablet (0.4 mg total) under the tongue every 5 (five) minutes as needed for chest pain. 25 tablet 3  . OMEPRAZOLE MAGNESIUM 20 MG PO TBEC Oral Take 20 mg by mouth daily.      Marland Kitchen PRADAXA 150 MG PO CAPS  TAKE 1 CAPSULE BY MOUTH 2 TIMES A DAY 180 capsule 3  . SIMVASTATIN 80 MG PO TABS  TAKE 1 TABLET DAILY 90 tablet 3    BP 154/80  Pulse 72  Temp(Src) 98.1 F (36.7 C) (Oral)  Resp 14  SpO2 99%  Physical Exam  Nursing note and vitals reviewed. Constitutional: He is oriented to person, place, and time. He appears well-developed and well-nourished.  HENT:  Head: Normocephalic and atraumatic.  Eyes: EOM are normal. Pupils are equal, round, and reactive to light.  Cardiovascular: Normal rate and regular rhythm.   Pulmonary/Chest: Effort normal and breath sounds normal. No respiratory distress.  Abdominal: Soft. There is no tenderness.  Musculoskeletal: He exhibits edema (trace, BLE).  Neurological: He is alert and oriented to person, place, and time.  Skin: Skin is warm and dry.  Psychiatric: He has a normal mood and affect.    ED Course  Procedures (including critical care time)  Labs Reviewed  BASIC METABOLIC PANEL - Abnormal; Notable for the following:    Glucose, Bld 139 (*)    GFR calc non Af Amer 86 (*)    All other components within normal limits  POCT I-STAT TROPONIN I - Abnormal; Notable for the following:    Troponin i, poc 0.14 (*)    All other components within normal limits  CBC  PROTIME-INR  I-STAT TROPONIN I  TROPONIN I   No results found.   Date: 04/29/2011  Rate:66  Rhythm: normal sinus rhythm  QRS Axis: normal  Intervals: normal  ST/T Wave abnormalities: normal  Conduction Disutrbances:none  Narrative Interpretation: NSR, no signs of acute ischemia  Old EKG Reviewed: changes noted (08/20/09) no longer in AF    1. Chest pain   2. NSTEMI (non-ST elevated myocardial  infarction)       MDM  This is a 70 year old male, with a significant history of coronary artery disease status post multiple stents, who states that over the past couple of weeks has had some chest discomfort intermittently while walking on the treadmill at the gym. He says these episodes resolve after several minutes of rest. He states that this evening at approximately 2100, he was sitting, and under no stress when he began to feel a significant chest discomfort  described as a tightness, without any other associated symptoms. He decided to come to the ED for further evaluation. EKG is as above, and i-STAT troponin obtained in triage shows mild elevation at 0.14. Will give the patient a full aspirin, check other labs, including a formal troponin; patient took his evening dose of Pradaxa at 8:30 this evening, so we'll not need heparin given until 12 hours from that time. Will consult the patient's cardiologist for admission. Discussed this plan with the patient and his wife who expressed understanding.        Richard Burrow, MD 04/30/11 [phone]

## 2011-04-29 NOTE — H&P (Signed)
Richard Shannon is an 70 y.o. male.   Chief Complaint: Chest Pain Primary Cardiologist: Dr. Swaziland HPI: Richard Shannon is a 70 yo man with PMH of CAD, first LHC/symptoms '93 with PCI to RCA and Left Circumflex in '93, last LHC in '04 demonstrating mild diffuse 3v disease, atrial fibrillation on pradaxa, HTN, morbid obesity with recent travel to [place] and clinic visit 04/19/11 for chest pain who presents with chest tightness beginning at 21:00 this evening. The pain is tight, substernal, no nausea/vomiting, no radiation. This time the pain occurred at rest and resolved after approximately 25 minutes. In the ER, when he was noted to have slightly + troponin I istat, he noted the CP again and the initiation of his atrial fibrillation. This CP also resolved. He goes on to tell me that he did not note CP during his two week trip to [place] with walking all day up and down hills to [employer]. However, in the recent 4 weeks he has noted CP at the gym on the treadmill that comes on slowly and goes away promptly with rest. On a recent business trip he had to make it from A --> E terminal and he had CP brought on by brisk walking that slowly eased with slowing his pace.   In the ER he received aspirin 324 mg and heparin gtt deferred as pradaxa taken at 20:30 pm this evening. No headache, no falls, no syncope.     Past Medical History  Diagnosis Date  . PAF (paroxysmal atrial fibrillation)   . Obesity   . Hypertension   . IHD (ischemic heart disease)     Remote cath in 1993 with rotablator the RCA and PCI to LCX. Repeat PCI to both vessels in 1993 due to restenosis. Last  cath in 2004 showing mild diffuse 3VD.   . WPW (Wolff-Parkinson-White syndrome) 1996    WITH ABLATION AT [facility]   . Chronic anticoagulation     on Pradaxa  . Glucose intolerance (impaired glucose tolerance)   . Normal nuclear stress test March 2012  . Abnormal chest CT     Chronic; checked yearly.   . Sleep apnea,  obstructive     on CPAP    Past Surgical History  Procedure Date  . Cardiac catheterization 1993  . Coronary angioplasty     LEFT CIRCUMFLEX  . Coronary angioplasty 1993    REPEAT DUE TO RESTENOSIS  . Cardiac catheterization 2004    SHOWED MILD DIFFUSE THREE VESSEL DISEASE  . Tonsillectomy   . Balloon angioplasty, artery     Family History  Problem Relation Age of Onset  . Stroke Mother   . Heart attack Father 53   Social History:  reports that he quit smoking about 31 years ago. His smoking use included Cigarettes. He has a 26 pack-year smoking history. He has never used smokeless tobacco. He reports that he does not drink alcohol or use illicit drugs.  Allergies:  Allergies  Allergen Reactions  . Sulfonamide Derivatives     Medications Prior to Admission  Medication Dose Route Frequency Provider Last Rate Last Dose  . aspirin chewable tablet 324 mg  324 mg Oral Once Theotis Burrow, MD   324 mg at 04/29/11 2303  . heparin ADULT infusion 100 units/ml (25000 units/250 ml)  1,400 Units/hr Intravenous Continuous Emeline Gins, PHARMD       Medications Prior to Admission  Medication Sig Dispense Refill  . Ascorbic Acid (VITAMIN C PO) Take  1,000 mg by mouth 2 (two) times daily.        . beta carotene 16967 UNIT capsule Take 25,000 Units by mouth daily.        Marland Kitchen diltiazem (CARDIZEM CD) 240 MG 24 hr capsule TAKE ONE CAPSULE BY MOUTH EVERY DAY  90 capsule  1  . enalapril (VASOTEC) 20 MG tablet Take 1 tablet (20 mg total) by mouth 2 (two) times daily.  180 tablet  1  . fish oil-omega-3 fatty acids 1000 MG capsule Take 1,200 mg by mouth 2 (two) times daily.        . hydrochlorothiazide 25 MG tablet Take 1 tablet (25 mg total) by mouth daily.  30 tablet  5  . metoprolol tartrate (LOPRESSOR) 25 MG tablet Take 25 mg by mouth 2 (two) times daily.        . montelukast (SINGULAIR) 10 MG tablet Take 10 mg by mouth at bedtime.        . Multiple Vitamin (MULTIVITAMIN)  tablet Take 1 tablet by mouth daily.        . nitroGLYCERIN (NITROSTAT) 0.4 MG SL tablet Place 1 tablet (0.4 mg total) under the tongue every 5 (five) minutes as needed for chest pain.  25 tablet  3  . omeprazole (PRILOSEC OTC) 20 MG tablet Take 20 mg by mouth daily.        Marland Kitchen PRADAXA 150 MG CAPS TAKE 1 CAPSULE BY MOUTH 2 TIMES A DAY  180 capsule  3  . simvastatin (ZOCOR) 80 MG tablet TAKE 1 TABLET DAILY  90 tablet  3    Review of Systems  Constitutional: Negative for fever, chills and malaise/fatigue.  HENT: Negative for hearing loss and neck pain.   Eyes: Negative for blurred vision and double vision.  Respiratory: Negative for cough and hemoptysis.   Cardiovascular: Positive for chest pain and palpitations. Negative for orthopnea and claudication.  Gastrointestinal: Negative for heartburn, nausea, vomiting and abdominal pain.  Genitourinary: Negative for dysuria, urgency and frequency.  Musculoskeletal: Negative for back pain and falls.  Skin: Negative for itching and rash.  Neurological: Negative for dizziness, tingling, tremors and headaches.  Endo/Heme/Allergies: Negative for environmental allergies. Does not bruise/bleed easily.  Psychiatric/Behavioral: Negative for depression and suicidal ideas.    Blood pressure 126/81, pulse 91, temperature 98.1 F (36.7 C), temperature source Oral, resp. rate 14, height 5' 8.5" (1.74 m), weight 145.514 kg (320 lb 12.8 oz), SpO2 92.00%. Physical Exam  Nursing note and vitals reviewed. Constitutional: He is oriented to person, place, and time. He appears well-developed and well-nourished. No distress.  HENT:  Head: Normocephalic and atraumatic.  Nose: Nose normal.  Mouth/Throat: Oropharynx is clear and moist. No oropharyngeal exudate.  Eyes: Conjunctivae and EOM are normal. Pupils are equal, round, and reactive to light. No scleral icterus.  Neck: Normal range of motion. Neck supple. No JVD present. No tracheal deviation present. No  thyromegaly present.  Cardiovascular: Normal heart sounds and intact distal pulses.  Exam reveals no gallop and no friction rub.   No murmur heard.      Irregularly irregular  Respiratory: Effort normal and breath sounds normal. No respiratory distress. He has no wheezes. He has no rales.  GI: Soft. Bowel sounds are normal. He exhibits no distension. There is no tenderness. There is no rebound.  Musculoskeletal: Normal range of motion. He exhibits edema. He exhibits no tenderness.       Trace pedal edema  Neurological: He is alert and oriented to person,  place, and time. No cranial nerve deficit. Coordination normal.  Skin: Skin is warm and dry. No rash noted. He is not diaphoretic. No erythema.  Psychiatric: He has a normal mood and affect. His behavior is normal. Thought content normal.     Chest x-ray reviewed; no acute process EKG reviewed from 04/19/11 with 1st degree AVB, sinus bradycardia EKG reviewed in ER from 04/29/11 with PR 190, sinus HR ~ 66, q in I  Labs reviewed; istat Troponin 0.14, plt 173, K 3.9, creatinine 0.83, INR 1.0 Troponin I <0.3  Problem list CP/troponinemia by istat Stuttering CP Known CAD Recent URI Recent evaluation for CP Atrial fibrillation on pradaxa, diltiazem Morbid obesity OSA HLD  HTN GERD  Assessment/Plan 70 yo man with known CAD, HLD, HTN, morbid obesity being admitted with chest pain and initial troponin I positive.   Unstable Angina/Possible NSTEMI (istat +, official Troponin I negative but ~ 1.5 hours after initiation of CP). Will treat as unstable angina. Aspirin/heparin. Will defer plavix given possibility of need for CABG. Discussed all possibilities with patient.  - ASA/heparin - defer plavix with borderline troponin and intermediate/high pre-test probability of multivessel disease - likely LHC in AM with early invasive strategy preferred given high TIMI risk profile; NPO except meds - continue home metoprolol 25 mg --> q6h - make  sure lipid profile, hba1c, tsh profile uptodate - if pain returns, will utilize NTG gtt as needed (currently CP free) Atrial fibrillation: no RVR, on anticoagulation; will hold pradaxa in favor of heparin; on metoprolol/diltiazem HTN: continue home medications; on metoprolol, diltiazem, enalapril HLD: continue simvastatin Obesity: continue diet/exercise    Alvin Rubano 04/29/2011, 11:43 PM

## 2011-04-29 NOTE — ED Notes (Signed)
Cardiology at bedside.

## 2011-04-29 NOTE — ED Notes (Signed)
PT. REPORTS SUBSTERNAL CHEST PAIN FOR SEVERAL WEEKS ,   AND AGAIN THIS EVENING 9 PM -  'TIGHTNESS" , DENIES SOB , NO COUGH , NAUSEA OR DIAPHORESIS.

## 2011-04-29 NOTE — ED Notes (Signed)
Notified Alfonzo Feller RN Engineer, manufacturing systems) and Theresia Bough RN of Troponin0.14 ng/mL  Advised patient will be moved to [id number]

## 2011-04-29 NOTE — ED Provider Notes (Addendum)
  I performed a history and physical examination of Richard Shannon and discussed his management with Dr. Leary Roca.  I agree with the history, physical, assessment, and plan of care, with the following exceptions: see below  I was present for the following procedures: None Time Spent in Critical Care of the patient: 30 min Time spent in discussions with the patient and family: 15 min   I agree with Dr. Thomes Lolling ECG interpretation.  Pt's symptoms are concerning for UA.  No sig ECG changes, no overt ST elevation or depression.  Initial troponin is slightly elevated at 0.14.  His symptoms have been ongoing for past week, worse with treadmill work at gym, improved with rest.  No pain currently.  Will start IV heparin, ASA, and admit to cardiology.    Richard Agosto Y.    Richard Shannon. Richard Wandel, MD 04/29/11 2305    11:52 PM I reviewed pt's CXR.  No acute abn's.    Richard Shannon. Oletta Lamas, MD 04/29/11 [phone]

## 2011-04-30 ENCOUNTER — Other Ambulatory Visit: Payer: Self-pay

## 2011-04-30 ENCOUNTER — Encounter (HOSPITAL_COMMUNITY)
Admission: EM | Disposition: A | Discharge status: Home / Self Care | Payer: Self-pay | Source: Home / Self Care | Attending: Cardiology

## 2011-04-30 ENCOUNTER — Encounter (HOSPITAL_COMMUNITY): Payer: Self-pay | Admitting: Internal Medicine

## 2011-04-30 DIAGNOSIS — I2510 Atherosclerotic heart disease of native coronary artery without angina pectoris: Secondary | ICD-10-CM

## 2011-04-30 DIAGNOSIS — I200 Unstable angina: Secondary | ICD-10-CM | POA: Insufficient documentation

## 2011-04-30 HISTORY — PX: CORONARY STENT PLACEMENT: SHX1402

## 2011-04-30 HISTORY — PX: LEFT HEART CATHETERIZATION WITH CORONARY ANGIOGRAM: SHX5451

## 2011-04-30 HISTORY — PX: PERCUTANEOUS CORONARY STENT INTERVENTION (PCI-S): SHX5485

## 2011-04-30 LAB — TROPONIN I: Troponin I: <0.3 ng/mL (ref <0.30)

## 2011-04-30 LAB — LIPID PANEL
HDL: 44 mg/dL (ref >39)
LDL Cholesterol: 74 mg/dL (ref 0–99)
Total CHOL/HDL Ratio: 3 RATIO
Triglycerides: 64 mg/dL (ref <150)

## 2011-04-30 LAB — CARDIAC PANEL(CRET KIN+CKTOT+MB+TROPI)
CK, MB: 6.7 ng/mL (ref 0.3–4.0)
Total CK: 225 U/L (ref 7–232)

## 2011-04-30 LAB — HEMOGLOBIN A1C
Hgb A1c MFr Bld: 6.3 % — ABNORMAL HIGH (ref <5.7)
Mean Plasma Glucose: 134 mg/dL — ABNORMAL HIGH (ref <117)

## 2011-04-30 SURGERY — LEFT HEART CATHETERIZATION WITH CORONARY ANGIOGRAM
Anesthesia: LOCAL

## 2011-04-30 MED ORDER — DIAZEPAM 5 MG PO TABS
5.0000 mg | ORAL_TABLET | ORAL | Status: AC
  Administered 2011-04-30: 5 mg via ORAL
  Filled 2011-04-30: qty 1

## 2011-04-30 MED ORDER — DIAZEPAM 5 MG PO TABS: 5.0000 mg | ORAL_TABLET | ORAL | Status: DC

## 2011-04-30 MED ORDER — CLOPIDOGREL BISULFATE 75 MG PO TABS
75.0000 mg | ORAL_TABLET | Freq: Every day | ORAL | Status: DC
  Administered 2011-05-01: 75 mg via ORAL
  Filled 2011-04-30: qty 1

## 2011-04-30 MED ORDER — SODIUM CHLORIDE 0.9 % IV SOLN: INTRAVENOUS | Status: AC

## 2011-04-30 MED ORDER — ACETAMINOPHEN 325 MG PO TABS: 650.0000 mg | ORAL_TABLET | ORAL | Status: DC | PRN

## 2011-04-30 MED ORDER — BIVALIRUDIN 250 MG IV SOLR
INTRAVENOUS | Status: AC
  Filled 2011-04-30: qty 250

## 2011-04-30 MED ORDER — NITROGLYCERIN 0.2 MG/ML ON CALL CATH LAB
INTRAVENOUS | Status: AC
  Filled 2011-04-30: qty 1

## 2011-04-30 MED ORDER — SODIUM CHLORIDE 0.9 % IV SOLN: 250.0000 mL | INTRAVENOUS | Status: DC | PRN

## 2011-04-30 MED ORDER — HEPARIN SODIUM (PORCINE) 1000 UNIT/ML IJ SOLN
INTRAMUSCULAR | Status: AC
  Filled 2011-04-30: qty 1

## 2011-04-30 MED ORDER — HYDROCHLOROTHIAZIDE 25 MG PO TABS
25.0000 mg | ORAL_TABLET | Freq: Every day | ORAL | Status: DC
  Administered 2011-04-30: 25 mg via ORAL
  Filled 2011-04-30 (×2): qty 1

## 2011-04-30 MED ORDER — SODIUM CHLORIDE 0.9 % IV SOLN
1.0000 mL/kg/h | INTRAVENOUS | Status: AC
  Administered 2011-04-30: 1 mL/kg/h via INTRAVENOUS

## 2011-04-30 MED ORDER — FAMOTIDINE IN NACL 20-0.9 MG/50ML-% IV SOLN
INTRAVENOUS | Status: AC
  Filled 2011-04-30: qty 50

## 2011-04-30 MED ORDER — METOPROLOL TARTRATE 25 MG PO TABS
25.0000 mg | ORAL_TABLET | Freq: Two times a day (BID) | ORAL | Status: DC
  Administered 2011-04-30 (×2): 25 mg via ORAL
  Filled 2011-04-30 (×6): qty 1

## 2011-04-30 MED ORDER — DILTIAZEM HCL ER COATED BEADS 240 MG PO CP24
240.0000 mg | ORAL_CAPSULE | Freq: Every day | ORAL | Status: DC
  Administered 2011-04-30: 240 mg via ORAL
  Filled 2011-04-30 (×3): qty 1

## 2011-04-30 MED ORDER — ONDANSETRON HCL 4 MG/2ML IJ SOLN: 4.0000 mg | Freq: Four times a day (QID) | INTRAMUSCULAR | Status: DC | PRN

## 2011-04-30 MED ORDER — SODIUM CHLORIDE 0.9 % IJ SOLN: 3.0000 mL | INTRAMUSCULAR | Status: DC | PRN

## 2011-04-30 MED ORDER — FAMOTIDINE IN NACL 20-0.9 MG/50ML-% IV SOLN
20.0000 mg | Freq: Once | INTRAVENOUS | Status: AC
  Administered 2011-04-30: 20 mg via INTRAVENOUS

## 2011-04-30 MED ORDER — NITROGLYCERIN 0.4 MG SL SUBL: 0.4000 mg | SUBLINGUAL_TABLET | SUBLINGUAL | Status: DC | PRN

## 2011-04-30 MED ORDER — ROSUVASTATIN CALCIUM 20 MG PO TABS
20.0000 mg | ORAL_TABLET | Freq: Every day | ORAL | Status: DC
  Administered 2011-04-30: 20 mg via ORAL
  Filled 2011-04-30 (×3): qty 1

## 2011-04-30 MED ORDER — ASPIRIN 81 MG PO CHEW: 324.0000 mg | CHEWABLE_TABLET | ORAL | Status: DC

## 2011-04-30 MED ORDER — ASPIRIN EC 81 MG PO TBEC: 81.0000 mg | DELAYED_RELEASE_TABLET | Freq: Every day | ORAL | Status: DC

## 2011-04-30 MED ORDER — HEPARIN (PORCINE) IN NACL 2-0.9 UNIT/ML-% IJ SOLN
INTRAMUSCULAR | Status: AC
  Filled 2011-04-30: qty 2000

## 2011-04-30 MED ORDER — SODIUM CHLORIDE 0.9 % IV SOLN: 1.0000 mL/kg/h | INTRAVENOUS | Status: DC

## 2011-04-30 MED ORDER — VERAPAMIL HCL 2.5 MG/ML IV SOLN
INTRAVENOUS | Status: AC
  Filled 2011-04-30: qty 2

## 2011-04-30 MED ORDER — ENALAPRIL MALEATE 20 MG PO TABS
20.0000 mg | ORAL_TABLET | Freq: Two times a day (BID) | ORAL | Status: DC
  Administered 2011-04-30 (×2): 20 mg via ORAL
  Filled 2011-04-30 (×5): qty 1

## 2011-04-30 MED ORDER — CLOPIDOGREL BISULFATE 300 MG PO TABS
ORAL_TABLET | ORAL | Status: AC
  Filled 2011-04-30: qty 2

## 2011-04-30 MED ORDER — SODIUM CHLORIDE 0.9 % IJ SOLN: 3.0000 mL | Freq: Two times a day (BID) | INTRAMUSCULAR | Status: DC

## 2011-04-30 MED ORDER — MIDAZOLAM HCL 2 MG/2ML IJ SOLN
INTRAMUSCULAR | Status: AC
  Filled 2011-04-30: qty 2

## 2011-04-30 MED ORDER — FENTANYL CITRATE 0.05 MG/ML IJ SOLN
INTRAMUSCULAR | Status: AC
  Filled 2011-04-30: qty 2

## 2011-04-30 MED ORDER — HEPARIN SOD (PORCINE) IN D5W 100 UNIT/ML IV SOLN
1400.0000 [IU]/h | INTRAVENOUS | Status: DC
  Filled 2011-04-30: qty 250

## 2011-04-30 MED ORDER — PANTOPRAZOLE SODIUM 40 MG PO TBEC
40.0000 mg | DELAYED_RELEASE_TABLET | Freq: Every day | ORAL | Status: DC
  Administered 2011-04-30: 40 mg via ORAL
  Filled 2011-04-30: qty 1

## 2011-04-30 MED ORDER — ASPIRIN 81 MG PO CHEW
324.0000 mg | CHEWABLE_TABLET | ORAL | Status: AC
  Administered 2011-04-30: 324 mg via ORAL

## 2011-04-30 MED ORDER — OMEPRAZOLE MAGNESIUM 20 MG PO TBEC: 20.0000 mg | DELAYED_RELEASE_TABLET | Freq: Every day | ORAL | Status: DC

## 2011-04-30 MED ORDER — LIDOCAINE HCL (PF) 1 % IJ SOLN
INTRAMUSCULAR | Status: AC
  Filled 2011-04-30: qty 30

## 2011-04-30 MED ORDER — DABIGATRAN ETEXILATE MESYLATE 150 MG PO CAPS
150.0000 mg | ORAL_CAPSULE | Freq: Two times a day (BID) | ORAL | Status: DC
  Filled 2011-04-30 (×2): qty 1

## 2011-04-30 MED ORDER — MONTELUKAST SODIUM 10 MG PO TABS
10.0000 mg | ORAL_TABLET | Freq: Every day | ORAL | Status: DC
  Administered 2011-04-30: 10 mg via ORAL
  Filled 2011-04-30 (×4): qty 1

## 2011-04-30 NOTE — Interval H&P Note (Signed)
History and Physical Interval Note:  04/30/2011 4:31 PM  [name]  has presented today for surgery, with the diagnosis of + Enzymes  The various methods of treatment have been discussed with the patient and family. After consideration of risks, benefits and other options for treatment, the patient has consented to  Procedure(s): LEFT HEART CATHETERIZATION WITH CORONARY ANGIOGRAM as a surgical intervention .  The patients' history has been reviewed, patient examined, no change in status, stable for surgery.  I have reviewed the patients' chart and labs.  Questions were answered to the patient's satisfaction.     Lorine Bears

## 2011-04-30 NOTE — Op Note (Signed)
Cardiac Catheterization Procedure Note  Name: Richard Shannon MRN: [id number] DOB: July 25, 1940  Procedure: Left Heart Cath, Selective Coronary Angiography, LV angiography, PTCA and stenting of the proximal left anterior descending artery, PTCA and stenting of the distal right coronary artery. Both with bare-metal stents.  Indication: This is a 70 year old male with known history of coronary artery disease status post remote history of angioplasty. He has atrial fibrillation and is on long-term anticoagulation with Pradaxa. He presented with chest pain and was found to have a small non-ST elevation myocardial infarction. Cardiac catheterization was recommended. The patient took last dose of Pradaxa yesterday night. Due to that, it was elected to proceed with radial axis.  Procedural Details:  The right wrist was prepped, draped, and anesthetized with 1% lidocaine. Using the modified Seldinger technique, a 5 French sheath was introduced into the right radial artery. 3 mg of verapamil was administered through the sheath, weight-based unfractionated heparin was administered intravenously. A TIG 4 was used for for selective coronary angiography. A pigtail catheter was used for left ventriculography. Catheter exchanges were performed over an exchange length guidewire.  PROCEDURAL FINDINGS Hemodynamics: AO 147/79/111 mmHg LV 156/1. EDP: 7 mmHg   Coronary angiography: Coronary dominance: right  Left mainstem: The vessel is normal in size and free of any significant obstructive disease.  Left anterior descending (LAD): The vessel overall is normal in size. There is a discrete 95% stenosis proximally before a large diagonal branch. The mid LAD has diffuse 20% disease. The rest of the LAD is free of any significant disease. First diagonal is normal in size and free of any significant disease. Second and third diagonals are overall small.  Left circumflex (LCx): The vessel is normal in size and nondominant.  There is diffuse 30% disease in the midsegment. OM1 is a large-size branch with minor irregularities. OM 2 is relatively small in size and free of significant disease. OM 3 is overall small in size.  Right coronary artery (RCA): The vessel is normal in size and dominant. There is a 40% proximal disease which is discrete. In the distal segment there is an 80% tubular stenosis. The rest of the vessel is free of any obstructive disease.  Left ventriculography: Left ventricular systolic function is normal, LVEF is estimated at 55-65%, there is no significant mitral regurgitation   PCI Note:  Following the diagnostic procedure, the decision was made to proceed with PCI. The radial sheath was upsized to a 6 [place]. Weight-based bivalirudin was given for anticoagulation. Once a therapeutic ACT was achieved, a 6 [place] XP 3.5 guide catheter was inserted into the left main.  An intuition coronary guidewire was used to cross the lesion.  The lesion was predilated with a 2.5 x 12 mm balloon.  The lesion was then stented with a 3.0 x 15 mm vision bare-metal stent.  The stent was postdilated with a 3.5 x 12 mm noncompliant balloon.  Following PCI, there was 0% residual stenosis and TIMI-3 flow. Final angiography confirmed an excellent result.  I then used a JR 4 guiding catheter to engage the right coronary artery. The lesion was crossed with the same wire. It was predilated with 2.5 x 12 mm balloon. I then placed a 2.75 x 18 mm vision bare-metal stent. This was postdilated with a 3.25 x 12 mm noncompliant balloon. There was 0% residual stenosis with TIMI-3 flow. The patient tolerated the procedure well. There were no immediate procedural complications. A TR band was used for radial hemostasis. The patient was  transferred to the post catheterization recovery area for further monitoring.  PCI Data: Vessel - left anterior descending artery/Segment - proximal Percent Stenosis (pre)  95% TIMI-flow 3 Stent 3.0 x 15 mm  vision stent postdilated proximally to 3.5 Percent Stenosis (post) 0% TIMI-flow (post) 3  Vessel - right coronary artery/Segment - distal Percent Stenosis (pre)  80% TIMI-flow 3 Stent 2.75 x 18 mm vision stent postdilated with 3.25 balloon proximally Percent Stenosis (post) 0% TIMI-flow (post) 3  Final Conclusions:   1. Severe two-vessel coronary artery disease. 2. Normal LV systolic function.   3. Successful angioplasty and bare-metal stent placement to both proximal left anterior descending artery as well as distal right coronary artery.   Recommendations:  I elected to use bare-metal stents due to need for long-term anticoagulation with history of atrial fibrillation. The patient will be treated with Plavix for a minimum of one month and preferably for 3 months. Anticoagulation will be continued with Pradaxa which would be started tomorrow morning. I do not recommend aspirin to avoid excessive risk of bleeding.  Lorine Bears 04/30/2011, 6:22 PM

## 2011-04-30 NOTE — Progress Notes (Signed)
CRITICAL LAB VALUE: CKMB= 6.7 and TROP= 0.39.  Called Ward Givens at [phone] and paged [facility] at [phone]

## 2011-04-30 NOTE — Progress Notes (Addendum)
Cardiology Progress Note Patient Name: Richard Shannon Date of Encounter: 04/30/2011, 8:17 AM     Subjective  No overnight events. No complaints of chest pain, palpitations or shortness of breath overnight.   Objective   Telemetry: Atrial fibrillation 70s  Medications: . aspirin  324 mg Oral Once  . aspirin EC  81 mg Oral Daily  . diltiazem  240 mg Oral Daily  . enalapril  20 mg Oral BID  . hydrochlorothiazide  25 mg Oral Daily  . metoprolol tartrate  25 mg Oral BID  . montelukast  10 mg Oral QHS  . pantoprazole  40 mg Oral Q1200  . rosuvastatin  20 mg Oral Daily   Physical Exam: Temp:  [98.1 F (36.7 C)-98.4 F (36.9 C)] 98.3 F (36.8 C) (12/07 0559) Pulse Rate:  [66-99] 81  (12/07 0559) Resp:  [13-18] 18  (12/07 0559) BP: (121-188)/(76-97) 121/79 mmHg (12/07 0559) SpO2:  [92 %-100 %] 96 % (12/07 0559) Weight:  [143.3 kg (315 lb 14.7 oz)-145.514 kg (320 lb 12.8 oz)] 315 lb 14.7 oz (143.3 kg) (12/07 0145)  General: Overweight white male, in no acute distress. Head: Normocephalic, atraumatic, sclera non-icteric, nares are without discharge.  Neck: Supple. Negative for carotid bruits or JVD Lungs: Clear bilaterally to auscultation without wheezes, rales, or rhonchi. Breathing is unlabored. Heart: Irregular rate and rhythm. S1 S2 without murmurs, rubs, or gallops.  Abdomen: Obese, Soft, non-tender, non-distended with normoactive bowel sounds. No rebound/guarding. No obvious abdominal masses. Msk:  Strength and tone appear normal for age. Extremities: Trace bilat lower ext edema. No clubbing or cyanosis. Distal pedal pulses are 2+ and equal bilaterally. Neuro: Alert and oriented X 3. Moves all extremities spontaneously. Psych:  Responds to questions appropriately with a normal affect.  Labs:  [facility] 04/29/11 2211  NA 140  K 3.9  CL 104  CO2 27  GLUCOSE 139*  BUN 23  CREATININE 0.83  CALCIUM 9.7    Basename 04/29/11 2211  WBC 8.2  HGB 14.1  HCT 41.7  MCV  90.1  PLT 173    04/29/2011 22:26  Troponin i, poc 0.14 (HH)    04/29/2011 22:54 04/30/2011 06:27  CK, MB  6.7 (HH)  CK Total  225  Troponin I <0.30 0.39 (HH)     04/30/2011 06:27  Cholesterol 131  Triglycerides 64  HDL 44  LDL (calc) 74  VLDL 13  Total CHOL/HDL Ratio 3.0    Radiology/Studies:  Dg Chest 2 View 04/29/2011  *RADIOLOGY REPORT*  Clinical Data: Chest pain and pressure.  CHEST - 2 VIEW  Comparison: Chest radiograph performed 08/15/2009, and CT of the chest performed 12/31/2010  Findings: The lungs are well-aerated.  There is no evidence of focal opacification, pleural effusion or pneumothorax.  The nodular lesion at the right midlung is known to reflect a pulmonary vascular malformation; it is difficult to fully characterize on this study.  The heart is borderline normal in size; the mediastinal contour is within normal limits.  No acute osseous abnormalities are seen.  IMPRESSION:  1.  No acute cardiopulmonary process seen. 2.  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 His chest pain had occurred with exertion, however, yesterday it occurred at rest and he then presented to the hospital. In the ED his EKG was without acute ischemic changes, but his poc troponin was elevated at 0.14.   1. Chest pain/CAD: He has been chest pain free since admission, however, with his cardiac history and significant cardiac risk factors, as well as his elevated troponin this morning, he will go for a left heart catheterization. He took his Pradaxa last night and will therefore not go for cath until later this afternoon. No heparin  will be started prior to the procedure in order to reduce his risk of bleeding. Continue ASA, beta blocker, and statin.  2. Paroxysmal Atrial Fibrillation: He is currently in atrial fibrillation without RVR. He is asymptomatic. Continue diltiazem, beta blocker. Hold Pradaxa.  3. Chronic anticoagulation w/ Pradaxa: Hold as above  4. HTN: Stable. Cont home HCTZ, diltiazem, beta blocker and ACE-inhibitor. Monitor renal function post cath.  Signed, Odette Watanabe PA-C  I have personally seen and examined this patient with [facility], PA-C. I agree with her assessment and plan as outlined. The patient is admitted with unstable angina. His troponin this am is elevated at 0.39. No recurrence of chest pain. He took his Pradaxa at 8:30pm. AM dose is held. Radial pulse is good. Will proceed with left heart cath later in afternoon today from radial approach. Risks and benefits reviewed with pt and he agrees to proceed. Clear liquid breakfast then NPO.   MCALHANY,CHRISTOPHER 8:36 AM

## 2011-04-30 NOTE — H&P (View-Only) (Signed)
PHARMACY - ANTICOAGULATION CONSULT INITIAL NOTE  Pharmacy Consult for: Heparin  Indication: R/o ACS    Patient Data:   Allergies: Allergies  Allergen Reactions  . Sulfonamide Derivatives     Patient Measurements: Height: 5' 8.5" (174 cm) Weight: 320 lb 12.8 oz (145.514 kg) IBW/kg (Calculated) : 69.55  Adjusted Body Weight: 104.6 kg   Vital Signs: Temp:  [98.1 F (36.7 C)] 98.1 F (36.7 C) (12/06 2203) Pulse Rate:  [66-72] 72  (12/06 2253) Resp:  [14-18] 14  (12/06 2253) BP: (154-188)/(76-80) 154/80 mmHg (12/06 2253) SpO2:  [99 %-100 %] 99 % (12/06 2253) Weight:  [320 lb 12.8 oz (145.514 kg)] 320 lb 12.8 oz (145.514 kg) (12/06 2300)  Intake/Output from previous day: No intake or output data in the 24 hours ending 04/29/11 2315  Labs:  Basename 04/29/11 2211  HGB 14.1  HCT 41.7  PLT 173  APTT --  LABPROT --  INR --  HEPARINUNFRC --  CREATININE 0.83  CKTOTAL --  CKMB --  TROPONINI --   Estimated Creatinine Clearance: 115.5 ml/min (by C-G formula based on Cr of 0.83).  Medical History: Past Medical History  Diagnosis Date  . PAF (paroxysmal atrial fibrillation)   . Obesity   . Hypertension   . IHD (ischemic heart disease)     Remote cath in 1993 with rotablator the RCA and PCI to LCX. Repeat PCI to both vessels in 1993 due to restenosis. Last  cath in 2004 showing mild diffuse 3VD.   . WPW (Wolff-Parkinson-White syndrome) 1996    WITH ABLATION AT [facility]   . Chronic anticoagulation     on Pradaxa  . Glucose intolerance (impaired glucose tolerance)   . Normal nuclear stress test March 2012  . Abnormal chest CT     Chronic; checked yearly.   . Sleep apnea, obstructive     on CPAP    Scheduled medications:     . aspirin  324 mg Oral Once     Assessment:  70 y.o. male admitted on 04/29/2011, with r/o ACS. Pharmacy consulted to manage IV heparin. Patient with h/o atrial fibrillation on dabigatran PTA. Per patient last dose taken at 20:30  tonight. As estimated CrCl > 100 mL/min, dabigatran will provide anticoagulation for 12 hours. Will start IV heparin at 08:30 AM tomorrow at 1400 units/hr.   Goal of Therapy:  1. Heparin level 0.3-0.7 units/ml  Plan:  1.  At 0830 AM on 12/7, start IV heparin infusion at 1400 units/hr.  2. Heparin level at 1630 (8 hours after starting heparin). 3. Daily CBC, heparin level starting 12/8.   Serafina Topham C. Bexley Laubach, PharmD 04/29/2011, 11:15 PM   

## 2011-05-01 ENCOUNTER — Other Ambulatory Visit: Payer: Self-pay

## 2011-05-01 DIAGNOSIS — I214 Non-ST elevation (NSTEMI) myocardial infarction: Secondary | ICD-10-CM | POA: Diagnosis present

## 2011-05-01 LAB — BASIC METABOLIC PANEL
BUN: 14 mg/dL (ref 6–23)
Calcium: 9.7 mg/dL (ref 8.4–10.5)
Creatinine, Ser: 0.8 mg/dL (ref 0.50–1.35)
GFR calc Af Amer: >90 mL/min (ref >90)
GFR calc non Af Amer: 88 mL/min — ABNORMAL LOW (ref >90)

## 2011-05-01 LAB — CBC
HCT: 43.4 % (ref 39.0–52.0)
Hemoglobin: 15.2 g/dL (ref 13.0–17.0)
MCHC: 35 g/dL (ref 30.0–36.0)
MCV: 89.1 fL (ref 78.0–100.0)

## 2011-05-01 MED ORDER — CLOPIDOGREL BISULFATE 75 MG PO TABS: 75.0000 mg | ORAL_TABLET | Freq: Every day | ORAL | Status: DC

## 2011-05-01 NOTE — Discharge Summary (Signed)
Discharge Summary   Patient ID: Richard Shannon MRN: [id number], DOB/AGE: Jul 27, 1940 70 y.o.  Primary MD: Ginette Otto, MD Primary Cardiologist: Swaziland, PETER, MD  Admit date: 04/29/2011 D/C date:     05/01/2011      Primary Discharge Diagnoses:  1. NSTEMI   - s/p PTCA/BMS to Proximal LAD and distal RCA  Secondary Discharge Diagnoses:  1. Impaired Glucose Tolerance - A1C 6.3 2. Coronary Artery Disease - s/p PTCA to RCA & LCx '93, LHC '04 w/ mild 3V disease, and PCI as above 3. Paroxysmal Atrial Fibrillation - s/p DCCV 07/2010, on Pradaxa 4. Hypertension 5. Obesity  Allergies Allergies  Allergen Reactions  . Sulfonamide Derivatives     Diagnostic Studies/Procedures:  04/30/11 - Left Heart Cath, Selective Coronary Angiography, LV angiography Hemodynamics:  AO 147/79/111 mmHg  LV 156/1. EDP: 7 mmHg  Coronary dominance: right  Left mainstem: The vessel is normal in size and free of any significant obstructive disease.  Left anterior descending (LAD): The vessel overall is normal in size. There is a discrete 95% stenosis proximally before a large diagonal branch. The mid LAD has diffuse 20% disease. The rest of the LAD is free of any significant disease. First diagonal is normal in size and free of any significant disease. Second and third diagonals are overall small.  Left circumflex (LCx): The vessel is normal in size and nondominant. There is diffuse 30% disease in the midsegment. OM1 is a large-size branch with minor irregularities. OM 2 is relatively small in size and free of significant disease. OM 3 is overall small in size.  Right coronary artery (RCA): The vessel is normal in size and dominant. There is a 40% proximal disease which is discrete. In the distal segment there is an 80% tubular stenosis. The rest of the vessel is free of any obstructive disease.  Left ventriculography: Left ventricular systolic function is normal, LVEF is estimated at 55-65%, there is no  significant mitral regurgitation  Final Conclusions:  1. Severe two-vessel coronary artery disease.  2. Normal LV systolic function.  3. Successful angioplasty and bare-metal stent placement to both proximal left anterior descending artery as well as distal right coronary artery.  Recommendations:  I elected to use bare-metal stents due to need for long-term anticoagulation with history of atrial fibrillation. The patient will be treated with Plavix for a minimum of one month and preferably for 3 months. Anticoagulation will be continued with Pradaxa which would be started tomorrow morning. 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  Hospital Course: In the ED his CXR was without acute cardiopulmonary findings, his EKG was without acute ischemic changes, but his poc troponin was elevated at 0.14, continued to be elevated, and he ruled in for a NSTEMI. He took Pradaxa prior to presenting to the hospital so he was not started on a Heparin drip and his heart catheterization was planned for later the next afternoon. He was in atrial fibrillation, but it was rate controlled on his home diltiazem and beta blocker dosing. Left heart catheterization was performed on 04/30/11 with findings as noted above. He tolerated the procedure well and was able to ambulate with no further chest pain.  He was discharged in stable condition to home with plans to continue Plavix for a minimum of one month and preferably three months. He will continue his Pradaxa, avoid aspirin, and follow up as scheduled on Monday with [facility] and follow up with Dr. Swaziland in the next three months. He should also follow up with his primary care  physician regarding an A1C of 6.3 and elevated glucoses during his hospitalization.   Discharge Vitals: Blood pressure 104/51, pulse 70, temperature 97.8 F (36.6 C), temperature source Oral, resp. rate 19, height 5' 8.5" (1.74 m), weight 142.8 kg (314 lb 13.1 oz), SpO2 95.00%.  Labs: Lab Results  Component Value Date   WBC 8.3 05/01/2011   HGB 15.2 05/01/2011   HCT 43.4 05/01/2011   MCV 89.1 05/01/2011   PLT 182 05/01/2011    Lab 04/29/11 2211  NA 140  K 3.9  CL 104  CO2 27  BUN 23  CREATININE 0.83  CALCIUM 9.7  GLUCOSE 139*    04/29/2011 22:26  Troponin i, poc 0.14 (HH)   Basename 04/30/11 0627 04/29/11 2254  CKTOTAL 225 --  CKMB 6.7* --  TROPONINI 0.39* <0.30   Lab Results  Component Value Date   CHOL 131 04/30/2011   HDL 44 04/30/2011   LDLCALC 74 04/30/2011   TRIG 64 04/30/2011    04/30/2011 06:27  Hemoglobin A1C 6.3 (H)     04/30/2011 06:27  TSH 1.571    Discharge Medications   Current Discharge Medication List    START taking these medications   Details  clopidogrel (PLAVIX) 75 MG tablet Take 1 tablet (75 mg total) by mouth daily with breakfast. Qty: 30 tablet, Refills: 3      CONTINUE these medications which have NOT CHANGED   Details  Ascorbic Acid (VITAMIN C PO) Take 1,000 mg by mouth 2 (two) times daily.      beta carotene 16109 UNIT capsule Take 25,000 Units by mouth daily.      diltiazem (CARDIZEM CD) 240 MG 24 hr capsule TAKE ONE CAPSULE BY MOUTH EVERY DAY Qty: 90 capsule, Refills: 1    enalapril (VASOTEC) 20 MG tablet Take 1 tablet (20 mg total) by mouth 2 (two) times daily. Qty: 180 tablet, Refills: 1    fish oil-omega-3 fatty acids 1000 MG capsule Take 1,200 mg by mouth 2 (two) times daily.      hydrochlorothiazide 25 MG tablet Take 1 tablet (25 mg total) by mouth daily. Qty: 30 tablet, Refills: 5    metoprolol tartrate (LOPRESSOR) 25 MG tablet Take 25 mg by mouth 2 (two) times daily.      montelukast (SINGULAIR) 10 MG tablet Take 10 mg  by mouth at bedtime.      Multiple Vitamin (MULTIVITAMIN) tablet Take 1 tablet by mouth daily.      nitroGLYCERIN (NITROSTAT) 0.4 MG SL tablet Place 1 tablet (0.4 mg total) under the tongue every 5 (five) minutes as needed for chest pain. Qty: 25 tablet, Refills: 3    omeprazole (PRILOSEC OTC) 20 MG tablet Take 20 mg by mouth daily.      PRADAXA 150 MG CAPS TAKE 1 CAPSULE BY MOUTH 2 TIMES A DAY Qty: 180 capsule, Refills: 3    simvastatin (ZOCOR) 80 MG tablet TAKE 1 TABLET DAILY Qty: 90 tablet, Refills: 3        Disposition      Future Orders Please Complete By Expires   Diet - low sodium heart healthy  Increase activity slowly      Discharge instructions      Comments:   **PLEASE REMEMBER TO BRING ALL OF YOUR MEDICATIONS TO EACH OF YOUR FOLLOW-UP OFFICE VISITS.  KEEP WRIST CATHETERIZATION SITE CLEAN AND DRY. Call the office for any signs of bleeding, pus, swelling, increased pain, or any other concerns. NO HEAVY LIFTING (>10lbs) X 2 WEEKS. NO SEXUAL ACTIVITY X 2 WEEKS. NO DRIVING X 1 WEEK. NO SOAKING BATHS, HOT TUBS, POOLS, ETC., X 7 DAYS.      Discharge Orders   Future Appointments: Provider:             Department:             Dept Phone:   05/03/2011 1:30 PM Rosalio Macadamia, NP  [facility] [phone]   07/07/2011 9:00 AM Waymon Budge, MD [facility] [phone]   Follow-up Information    Follow up with Ginette Otto, MD. Make an appointment in 2 weeks.      Follow up with Peter Swaziland, MD in 3 months. (Office will call you with appointment)    Contact information:   [facility] [address] [place] [address] [phone]           Outstanding Labs/Studies: Follow up A1C of 6.3 with Primary Care  Duration of Discharge Encounter: Greater than 30 minutes including physician and PA time.  Signed, Adrain Butrick PA-C 05/01/2011, 8:50 AM

## 2011-05-01 NOTE — Discharge Summary (Signed)
See progress note Richard Shannon

## 2011-05-01 NOTE — Progress Notes (Signed)
Cardiac Rehab Phase I  1610 - [phone] Education done with understanding. Permission for out pt cardiac rehab.  Rosalie Doctor

## 2011-05-01 NOTE — Progress Notes (Signed)
TR BAND REMOVAL  LOCATION:    right radial  DEFLATED PER PROTOCOL:    yes  TIME BAND OFF / DRESSING APPLIED:    2330   SITE UPON ARRIVAL:    Level 0  SITE AFTER BAND REMOVAL:    Level 0  REVERSE ALLEN'S TEST:   pos   CIRCULATION SENSATION AND MOVEMENT:    Within Normal Limits   yes  COMMENTS:

## 2011-05-01 NOTE — Progress Notes (Signed)
@   Subjective:  Denies CP or dyspnea   Objective:  Filed Vitals:   04/30/11 2208 05/01/11 0027 05/01/11 0550 05/01/11 0747  BP: 128/65 112/70 114/63 104/51  Pulse: 77 76 70 70  Temp:  98.3 F (36.8 C) 98.1 F (36.7 C) 97.8 F (36.6 C)  TempSrc:  Oral Oral Oral  Resp:  13 19 19   Height:      Weight:   314 lb 13.1 oz (142.8 kg)   SpO2:  97% 94% 95%    Intake/Output from previous day:  Intake/Output Summary (Last 24 hours) at 05/01/11 0825 Last data filed at 05/01/11 0028  Gross per 24 hour  Intake    360 ml  Output    925 ml  Net   -565 ml    Physical Exam: Physical exam: Well-developed obese in no acute distress.  Skin is warm and dry.  HEENT is normal.  Neck is supple. No thyromegaly.  Chest is clear to auscultation with normal expansion.  Cardiovascular exam is irregular Abdominal exam nontender or distended. No masses palpated. Extremities show no edema. Radial cath site with no hematoma neuro grossly intact    Lab Results: Basic Metabolic Panel:  Basename 05/01/11 0630 04/29/11 2211  NA 136 140  K 3.9 3.9  CL 101 104  CO2 25 27  GLUCOSE 133* 139*  BUN 14 23  CREATININE 0.80 0.83  CALCIUM 9.7 9.7  MG -- --  PHOS -- --   CBC:  Basename 05/01/11 0630 04/29/11 2211  WBC 8.3 8.2  NEUTROABS -- --  HGB 15.2 14.1  HCT 43.4 41.7  MCV 89.1 90.1  PLT 182 173   Cardiac Enzymes:  Basename 04/30/11 0627 04/29/11 2254  CKTOTAL 225 --  CKMB 6.7* --  CKMBINDEX -- --  TROPONINI 0.39* <0.30     Basename 04/30/11 0627  HGBA1C 6.3*   Fasting Lipid Panel:  Basename 04/30/11 0627  CHOL 131  HDL 44  LDLCALC 74  TRIG 64  CHOLHDL 3.0  LDLDIRECT --   Thyroid Function Tests:  Basename 04/30/11 0627  TSH 1.571  T4TOTAL --  T3FREE --  THYROIDAB --      Assessment/Plan:  1) NSTEMI - Patient s/p 2 vessel PCI; continue plavix; no ASA per Dr Kirke Corin; continue beta blocker and statin. 2) Atrial fibrillation; continue pradaxa and  cardizem/metoprolol. 3) hyperlipidemia - continue statin DC today and fu Monday as scheduled and Dr Swaziland 3 months > 30 min PA and physician time D2  [name] 05/01/2011, 8:25 AM

## 2011-05-02 NOTE — ED Provider Notes (Signed)
See my addendum note  Reshonda Koerber Y. Rose-Marie Hickling, MD 05/02/11 0705 

## 2011-05-03 ENCOUNTER — Encounter: Payer: Self-pay | Admitting: Nurse Practitioner

## 2011-05-03 ENCOUNTER — Ambulatory Visit (INDEPENDENT_AMBULATORY_CARE_PROVIDER_SITE_OTHER): Payer: Medicare Other | Admitting: Nurse Practitioner

## 2011-05-03 VITALS — BP 118/80 | HR 70 | Ht 68.5 in | Wt 321.0 lb

## 2011-05-03 DIAGNOSIS — I4891 Unspecified atrial fibrillation: Secondary | ICD-10-CM

## 2011-05-03 DIAGNOSIS — I2510 Atherosclerotic heart disease of native coronary artery without angina pectoris: Secondary | ICD-10-CM

## 2011-05-03 DIAGNOSIS — E669 Obesity, unspecified: Secondary | ICD-10-CM

## 2011-05-03 NOTE — Patient Instructions (Signed)
Stay on your current medicines  You may resume your exercise in about 1 week. Start out slow and work your way back up to 45 minutes.  We will see you back in about 3 weeks.  Call the [facility] office at [phone] if you have any questions, problems or concerns.

## 2011-05-03 NOTE — Assessment & Plan Note (Signed)
Has had recent NSTEMI. EF is normal. Now s/p BMS to the prox LAD and distal RCA. He is doing well. We will see him back in 3 months. He will not return to the gym until next week. He will start out slow and work his way back up. Patient is agreeable to this plan and will call if any problems develop in the interim.

## 2011-05-03 NOTE — Assessment & Plan Note (Signed)
EKG is done today and shows atrial fib. Rate is controlled. He has restarted his Pradaxa. We will repeat on return visit. Hopefully he will convert back on his own.

## 2011-05-03 NOTE — Progress Notes (Signed)
[name] Date of Birth: Jan 21, 1941 Medical Record #[id number]  History of Present Illness: Louanne Skye is seen back today for a follow up visit. It is a 2 week check. He is seen for Dr. Swaziland. He has had a few episodes of chest pain. We elected to follow him clinically and see how he did. He had what was felt to be a low risk Myoview this past March with inferior infarct versus diaphragmatic attenuation. He then presented to the hospital with recurrent chest pain. He had [place] the [place] airport and had some mild discomfort with going from terminal A to terminal E. He made it back to [place]. The following night, he had pain at rest and was referred on to the ER.   Troponin was mildly positive. He had repeat cath and had BMS to the proximal LAD and distal RCA.EF is normal.  He is now on Plavix for one month, hopefully for 3 months, along with his Pradaxa. No aspirin on board.   He has done well since discharge. No chest pain. He is back in atrial fib. Not really aware. Maybe a little fatigued but attributed that more to the hospital stay. Has had some bruising.    Current Outpatient Prescriptions on File Prior to Visit  Medication Sig Dispense Refill  . Ascorbic Acid (VITAMIN C PO) Take 1,000 mg by mouth 2 (two) times daily.        . beta carotene 52841 UNIT capsule Take 25,000 Units by mouth daily.        . clopidogrel (PLAVIX) 75 MG tablet Take 1 tablet (75 mg total) by mouth daily with breakfast.  30 tablet  3  . diltiazem (CARDIZEM CD) 240 MG 24 hr capsule TAKE ONE CAPSULE BY MOUTH EVERY DAY  90 capsule  1  . enalapril (VASOTEC) 20 MG tablet Take 1 tablet (20 mg total) by mouth 2 (two) times daily.  180 tablet  1  . fish oil-omega-3 fatty acids 1000 MG capsule Take 1,200 mg by mouth 2 (two) times daily.        . hydrochlorothiazide 25 MG tablet Take 1 tablet (25 mg total) by mouth daily.  30 tablet  5  . metoprolol tartrate (LOPRESSOR) 25 MG tablet Take 25 mg by mouth 2 (two) times  daily.        . montelukast (SINGULAIR) 10 MG tablet Take 10 mg by mouth at bedtime.        . Multiple Vitamin (MULTIVITAMIN) tablet Take 1 tablet by mouth daily.        . nitroGLYCERIN (NITROSTAT) 0.4 MG SL tablet Place 1 tablet (0.4 mg total) under the tongue every 5 (five) minutes as needed for chest pain.  25 tablet  3  . omeprazole (PRILOSEC OTC) 20 MG tablet Take 20 mg by mouth daily.        Marland Kitchen PRADAXA 150 MG CAPS TAKE 1 CAPSULE BY MOUTH 2 TIMES A DAY  180 capsule  3  . simvastatin (ZOCOR) 80 MG tablet TAKE 1 TABLET DAILY  90 tablet  3    Allergies  Allergen Reactions  . Sulfonamide Derivatives     Past Medical History  Diagnosis Date  . PAF (paroxysmal atrial fibrillation)   . Obesity   . Hypertension   . IHD (ischemic heart disease)     Remote cath in 1993 with rotablator the RCA and PCI to LCX. Repeat PCI to both vessels in 1993 due to restenosis. Last  cath in 2004 showing  mild diffuse 3VD.  S/P NSTEMI in December 2012 with BMS to the prox LAD & distal RCA  . WPW (Wolff-Parkinson-White syndrome) 1996    WITH ABLATION AT [facility]   . Chronic anticoagulation     on Pradaxa  . Glucose intolerance (impaired glucose tolerance)     A1C 6.3 in December 2012  . Normal nuclear stress test March 2012  . Abnormal chest CT     Chronic; checked yearly.   . Sleep apnea, obstructive     on CPAP    Past Surgical History  Procedure Date  . Cardiac catheterization 1993  . Coronary angioplasty     LEFT CIRCUMFLEX  . Coronary angioplasty 1993    REPEAT DUE TO RESTENOSIS  . Cardiac catheterization 2004    SHOWED MILD DIFFUSE THREE VESSEL DISEASE  . Tonsillectomy   . Coronary stent placement Apr 30, 2011    prox LAD and distal RCA    History  Smoking status  . Former Smoker -- 1.0 packs/day for 26 years  . Types: Cigarettes  . Quit date: 10/26/1979  Smokeless tobacco  . Never Used  Comment:      History  Alcohol Use  . 0.6 oz/week  . 1 Glasses of wine per week     Family History  Problem Relation Age of Onset  . Stroke Mother   . Heart attack Father 43    Review of Systems: The review of systems is per the HPI.  All other systems were reviewed and are negative.  Physical Exam: BP 118/80  Pulse 70  Wt 321 lb (145.605 kg) Patient is very pleasant and in no acute distress. He is morbidly obese. Skin is warm and dry. Color is normal. Some bruising on the arms noted. Cath site is ok (right radial).  HEENT is unremarkable. Normocephalic/atraumatic. PERRL. Sclera are nonicteric. Neck is supple. No masses. No JVD. Lungs are clear. Cardiac exam shows a regular rate and rhythm. Abdomen is obese but soft. Extremities are without edema. Gait and ROM are intact. No gross neurologic deficits noted.   LABORATORY DATA: EKG shows atrial fib with a controlled ventricular response.   Assessment / Plan:

## 2011-05-03 NOTE — Assessment & Plan Note (Signed)
Had long discussion regarding need for weight loss. He seems a little more motivated.

## 2011-05-04 ENCOUNTER — Telehealth: Payer: Self-pay | Admitting: Nurse Practitioner

## 2011-05-04 MED FILL — Dextrose Inj 5%: INTRAVENOUS | Qty: 50 | Status: AC

## 2011-05-04 NOTE — Telephone Encounter (Signed)
New msg Pt had cath done on Friday. He had some questions and wants to have copy of pictures of test. Please call

## 2011-05-05 NOTE — Telephone Encounter (Signed)
[email or web address]

## 2011-05-06 NOTE — Telephone Encounter (Signed)
Called him back today. He wanted to get pictures of his cath. Advised he would have to call cath lab at [facility] to request that they burn a disc for him. He says he only wanted the pictures. Advised everything is on computer so would have to have disc.

## 2011-05-07 ENCOUNTER — Telehealth: Payer: Self-pay | Admitting: Physician Assistant

## 2011-05-07 NOTE — Telephone Encounter (Signed)
Pt had recent NSTEMI with stents and now has viral URI. Wants to know what OTC Rx is OK. Advised him plain Benadryl, Robitussin, Tylenol or Mucinex would be OK but combo products should be avoided. Pt said he would do so.

## 2011-05-27 ENCOUNTER — Ambulatory Visit (INDEPENDENT_AMBULATORY_CARE_PROVIDER_SITE_OTHER): Payer: Medicare Other | Admitting: Nurse Practitioner

## 2011-05-27 ENCOUNTER — Encounter: Payer: Self-pay | Admitting: Nurse Practitioner

## 2011-05-27 VITALS — BP 124/70 | HR 70 | Ht 68.0 in | Wt 324.0 lb

## 2011-05-27 DIAGNOSIS — I2510 Atherosclerotic heart disease of native coronary artery without angina pectoris: Secondary | ICD-10-CM

## 2011-05-27 DIAGNOSIS — I4891 Unspecified atrial fibrillation: Secondary | ICD-10-CM

## 2011-05-27 NOTE — Assessment & Plan Note (Signed)
He is doing well. Will try to keep him on the Plavix for a total of 3 months post PCI. I will have him see Dr. Swaziland in 3 months. He seems motivated to make his lifestyle changes. Hopefully, he will be able to follow thru this time. Patient is agreeable to this plan and will call if any problems develop in the interim.

## 2011-05-27 NOTE — Patient Instructions (Signed)
Stay on your current medicines.  Keep on track with your diet and weight loss. Goal for exercise is 45 to 60 minutes each day.  We will see you back in 3 months.   Call the [facility] office at [phone] if you have any questions, problems or concerns.

## 2011-05-27 NOTE — Assessment & Plan Note (Signed)
He is in atrial fib by my exam today. He remains on his Pradaxa. He is totally asymptomatic. His rate is controlled. Will continue with anticoagulation and rate control. He is not interested in [facility] or other measures at this time. Patient is agreeable to this plan and will call if any problems develop in the interim.

## 2011-05-27 NOTE — Progress Notes (Signed)
Richard Shannon Date of Birth: 02-17-1941 Medical Record #[id number]  History of Present Illness: Richard Shannon is seen back today for a 3 week check. He is seen for Dr. Swaziland. He had NSTEMI in December with 2 vessel BMS to the proximal LAD and distal RCA. Has mild residual disease in the LCX. On Pradaxa and Plavix. Trying to keep on Plavix for 3 months. He is committed to long term Pradax regardless. He feels good overall. No recurrent chest pain. Getting back on track with his exercise program. Has actually gone and seen the nutritionist yesterday and seems more committed to making changes. He has no awareness whatsoever of his atrial fib. Energy level is good. He does have a little stomach upset with his Plavix but is now taking it with food.   Current Outpatient Prescriptions on File Prior to Visit  Medication Sig Dispense Refill  . Ascorbic Acid (VITAMIN C PO) Take 1,000 mg by mouth 2 (two) times daily.        . beta carotene 69629 UNIT capsule Take 25,000 Units by mouth daily.        . clopidogrel (PLAVIX) 75 MG tablet Take 1 tablet (75 mg total) by mouth daily with breakfast.  30 tablet  3  . diltiazem (CARDIZEM CD) 240 MG 24 hr capsule TAKE ONE CAPSULE BY MOUTH EVERY DAY  90 capsule  1  . enalapril (VASOTEC) 20 MG tablet Take 1 tablet (20 mg total) by mouth 2 (two) times daily.  180 tablet  1  . fish oil-omega-3 fatty acids 1000 MG capsule Take 1,200 mg by mouth 2 (two) times daily.        . hydrochlorothiazide 25 MG tablet Take 1 tablet (25 mg total) by mouth daily.  30 tablet  5  . metoprolol tartrate (LOPRESSOR) 25 MG tablet Take 25 mg by mouth 2 (two) times daily.        . montelukast (SINGULAIR) 10 MG tablet Take 10 mg by mouth at bedtime.        . Multiple Vitamin (MULTIVITAMIN) tablet Take 1 tablet by mouth daily.        . nitroGLYCERIN (NITROSTAT) 0.4 MG SL tablet Place 1 tablet (0.4 mg total) under the tongue every 5 (five) minutes as needed for chest pain.  25 tablet  3  . omeprazole  (PRILOSEC OTC) 20 MG tablet Take 20 mg by mouth daily.        Marland Kitchen PRADAXA 150 MG CAPS TAKE 1 CAPSULE BY MOUTH 2 TIMES A DAY  180 capsule  3  . simvastatin (ZOCOR) 80 MG tablet TAKE 1 TABLET DAILY  90 tablet  3    Allergies  Allergen Reactions  . Sulfonamide Derivatives     Past Medical History  Diagnosis Date  . PAF (paroxysmal atrial fibrillation)   . Obesity   . Hypertension   . IHD (ischemic heart disease)     Remote cath in 1993 with rotablator the RCA and PCI to LCX. Repeat PCI to both vessels in 1993 due to restenosis. Last  cath in 2004 showing mild diffuse 3VD.  S/P NSTEMI in December 2012 with BMS to the prox LAD & distal RCA  . WPW (Wolff-Parkinson-White syndrome) 1996    WITH ABLATION AT [facility]   . Chronic anticoagulation     on Pradaxa  . Glucose intolerance (impaired glucose tolerance)     A1C 6.3 in December 2012  . Normal nuclear stress test March 2012  . Abnormal chest CT  Chronic; checked yearly.   . Sleep apnea, obstructive     on CPAP    Past Surgical History  Procedure Date  . Cardiac catheterization 1993  . Coronary angioplasty     LEFT CIRCUMFLEX  . Coronary angioplasty 1993    REPEAT DUE TO RESTENOSIS  . Cardiac catheterization 2004    SHOWED MILD DIFFUSE THREE VESSEL DISEASE  . Tonsillectomy   . Coronary stent placement Apr 30, 2011    prox LAD and distal RCA    History  Smoking status  . Former Smoker -- 1.0 packs/day for 26 years  . Types: Cigarettes  . Quit date: 10/26/1979  Smokeless tobacco  . Never Used  Comment:      History  Alcohol Use  . 0.6 oz/week  . 1 Glasses of wine per week    Family History  Problem Relation Age of Onset  . Stroke Mother   . Heart attack Father 31    Review of Systems: The review of systems is per the HPI.  All other systems were reviewed and are negative.  Physical Exam: BP 124/70  Pulse 70  Ht 5\' 8"  (1.727 m)  Wt 324 lb (146.965 kg)  BMI 49.26 kg/m2 Patient is very pleasant and  in no acute distress. He is obese. Skin is warm and dry. Color is normal.  HEENT is unremarkable. Normocephalic/atraumatic. PERRL. Sclera are nonicteric. Neck is supple. No masses. No JVD. Lungs are clear. Cardiac exam shows an irregular rhythm. His rate is controlled. Abdomen is soft. Extremities are without edema. Gait and ROM are intact. No gross neurologic deficits noted.   LABORATORY DATA:   Assessment / Plan:

## 2011-07-07 ENCOUNTER — Ambulatory Visit: Payer: MEDICARE | Admitting: Internal Medicine

## 2011-07-13 ENCOUNTER — Ambulatory Visit: Payer: Self-pay | Admitting: Internal Medicine

## 2011-07-30 ENCOUNTER — Ambulatory Visit: Payer: Self-pay | Admitting: Internal Medicine

## 2011-08-02 ENCOUNTER — Encounter: Payer: Self-pay | Admitting: Internal Medicine

## 2011-08-02 ENCOUNTER — Ambulatory Visit (INDEPENDENT_AMBULATORY_CARE_PROVIDER_SITE_OTHER): Payer: Medicare Other | Admitting: Internal Medicine

## 2011-08-02 VITALS — BP 114/70 | HR 81 | Ht 68.0 in | Wt 314.0 lb

## 2011-08-02 DIAGNOSIS — G4710 Hypersomnia, unspecified: Secondary | ICD-10-CM

## 2011-08-02 DIAGNOSIS — J984 Other disorders of lung: Secondary | ICD-10-CM

## 2011-08-02 DIAGNOSIS — G4733 Obstructive sleep apnea (adult) (pediatric): Secondary | ICD-10-CM

## 2011-08-02 DIAGNOSIS — R911 Solitary pulmonary nodule: Secondary | ICD-10-CM

## 2011-08-02 DIAGNOSIS — Z9989 Dependence on other enabling machines and devices: Secondary | ICD-10-CM

## 2011-08-02 NOTE — Progress Notes (Signed)
08/02/11- 58 yoM former smoker followed for OSA, complicated by CAD/ MI, AFib, WPW, Lung nodule LOV-  at he continues using CPAP all night every night at 16 CWP/Advanced. Dislikes the fifth of his full facemask. Likes Singulair, helps his breathing. Coronary artery disease got stented. Chest CT January 2012-lung nodule= a vascular nodule in the right lower lobe to be followed for 2 years.  ROS-see HPI Constitutional:   No-   weight loss, night sweats, fevers, chills, fatigue, lassitude. HEENT:   No-  headaches, difficulty swallowing, tooth/dental problems, sore throat,       No-  sneezing, itching, ear ache, nasal congestion, post nasal drip,  CV:  No-   chest pain, orthopnea, PND, swelling in lower extremities, anasarca, dizziness, palpitations Resp: No- acute  shortness of breath with exertion or at rest.              No-   productive cough,  No non-productive cough,  No- coughing up of blood.              No-   change in color of mucus.  No- wheezing.   Skin: No-   rash or lesions. GI:  No-   heartburn, indigestion, abdominal pain, nausea, vomiting,  GU: . MS:  No-   joint pain or swelling.   Neuro-     nothing unusual Psych:  No- change in mood or affect. No depression or anxiety.  No memory loss.  OBJ- Physical Exam General- Alert, Oriented, Affect-appropriate, Distress- none acute, obese Skin- rash-none, lesions- none, excoriation- none Lymphadenopathy- none Head- atraumatic            Eyes- Gross vision intact, PERRLA, conjunctivae and secretions clear            Ears- Hearing, canals-normal            Nose- Clear, no-Septal dev, mucus, polyps, erosion, perforation             Throat- Mallampati II , mucosa clear , drainage- none, tonsils- atrophic Neck- flexible , trachea midline, no stridor , thyroid nl, carotid no bruit Chest - symmetrical excursion , unlabored           Heart/CV- RRR , no murmur , no gallop  , no rub, nl s1 s2                           - JVD- none , edema-  none, stasis changes- none, varices- none           Lung- clear to P&A, wheeze- none, cough- none , dullness-none, rub- none           Chest wall-  Abd- Br/ Gen/ Rectal- Not done, not indicated Extrem- cyanosis- none, clubbing, none, atrophy- none, strength- nl Neuro- grossly intact to observation

## 2011-08-02 NOTE — Patient Instructions (Addendum)
Order- DME Advanced- reduce CPAP pressure to 15  Order- script replacement CPAP mask of choice and supplies  Our [facility] can give names for alternative DME companies  Order- CT angio chest  "vascular lung nodule right lung"    (Recent BMET at [facility] for  Contrast dye)

## 2011-08-03 ENCOUNTER — Inpatient Hospital Stay: Admission: RE | Admit: 2011-08-03 | Payer: Medicare Other | Source: Ambulatory Visit

## 2011-08-05 NOTE — Assessment & Plan Note (Signed)
Follow up CT angiogram.

## 2011-08-05 NOTE — Assessment & Plan Note (Signed)
Plan-recommend weight loss. Good compliance and control. Can reduce pressure to 15 to reduce leak. He'll talk with home care company about mask choices

## 2011-08-10 ENCOUNTER — Telehealth: Payer: Self-pay | Admitting: Nurse Practitioner

## 2011-08-10 ENCOUNTER — Telehealth: Payer: Self-pay | Admitting: Internal Medicine

## 2011-08-10 NOTE — Telephone Encounter (Signed)
Spoke with pt. He states that [facility] found the order in question and nothing further needed.

## 2011-08-10 NOTE — Telephone Encounter (Signed)
LOV,Cath faxed to Dr.Spencer [phone] ( Per Pt)  Pt also Picked up Copy for Himself 08/10/11/KM

## 2011-08-11 ENCOUNTER — Ambulatory Visit: Payer: Medicare Other | Admitting: Cardiology

## 2011-08-16 ENCOUNTER — Other Ambulatory Visit: Payer: Self-pay | Admitting: Cardiology

## 2011-08-17 ENCOUNTER — Ambulatory Visit (INDEPENDENT_AMBULATORY_CARE_PROVIDER_SITE_OTHER)
Admission: RE | Admit: 2011-08-17 | Discharge: 2011-08-17 | Disposition: A | Discharge status: Home / Self Care | Payer: Medicare Other | Source: Ambulatory Visit | Attending: Internal Medicine | Admitting: Internal Medicine

## 2011-08-17 DIAGNOSIS — R911 Solitary pulmonary nodule: Secondary | ICD-10-CM

## 2011-08-17 MED ORDER — IOHEXOL 300 MG/ML  SOLN
80.0000 mL | Freq: Once | INTRAMUSCULAR | Status: AC | PRN
  Administered 2011-08-17: 80 mL via INTRAVENOUS

## 2011-08-18 ENCOUNTER — Other Ambulatory Visit: Payer: Self-pay | Admitting: *Deleted

## 2011-08-18 MED ORDER — HYDROCHLOROTHIAZIDE 25 MG PO TABS: 25.0000 mg | ORAL_TABLET | Freq: Every day | ORAL | Status: AC

## 2011-08-19 ENCOUNTER — Other Ambulatory Visit: Payer: Self-pay | Admitting: *Deleted

## 2011-08-30 ENCOUNTER — Telehealth: Payer: Self-pay | Admitting: Internal Medicine

## 2011-08-30 NOTE — Telephone Encounter (Signed)
Ct report faxed. Pt is aware.  Carron Curie, CMA

## 2011-09-13 ENCOUNTER — Ambulatory Visit: Payer: Medicare Other | Admitting: Cardiology

## 2011-09-18 ENCOUNTER — Other Ambulatory Visit: Payer: Self-pay | Admitting: Cardiology

## 2011-09-21 ENCOUNTER — Other Ambulatory Visit: Payer: Self-pay | Admitting: Cardiology

## 2011-09-27 ENCOUNTER — Other Ambulatory Visit: Payer: Self-pay | Admitting: Cardiology

## 2011-09-28 ENCOUNTER — Other Ambulatory Visit: Payer: Self-pay | Admitting: *Deleted

## 2011-09-28 MED ORDER — DILTIAZEM HCL ER COATED BEADS 240 MG PO CP24: 240.0000 mg | ORAL_CAPSULE | Freq: Every day | ORAL | Status: AC

## 2011-09-29 ENCOUNTER — Other Ambulatory Visit: Payer: Self-pay | Admitting: Cardiology

## 2011-09-30 ENCOUNTER — Encounter (HOSPITAL_COMMUNITY)
Admission: RE | Admit: 2011-09-30 | Discharge: 2011-09-30 | Disposition: A | Discharge status: Home / Self Care | Payer: Medicare Other | Source: Ambulatory Visit | Attending: Cardiology | Admitting: Cardiology

## 2011-09-30 DIAGNOSIS — I252 Old myocardial infarction: Secondary | ICD-10-CM | POA: Insufficient documentation

## 2011-09-30 DIAGNOSIS — I10 Essential (primary) hypertension: Secondary | ICD-10-CM | POA: Insufficient documentation

## 2011-09-30 DIAGNOSIS — G4733 Obstructive sleep apnea (adult) (pediatric): Secondary | ICD-10-CM | POA: Insufficient documentation

## 2011-09-30 DIAGNOSIS — K219 Gastro-esophageal reflux disease without esophagitis: Secondary | ICD-10-CM | POA: Insufficient documentation

## 2011-09-30 DIAGNOSIS — Z5189 Encounter for other specified aftercare: Secondary | ICD-10-CM | POA: Insufficient documentation

## 2011-09-30 DIAGNOSIS — E785 Hyperlipidemia, unspecified: Secondary | ICD-10-CM | POA: Insufficient documentation

## 2011-09-30 DIAGNOSIS — Z9861 Coronary angioplasty status: Secondary | ICD-10-CM | POA: Insufficient documentation

## 2011-09-30 DIAGNOSIS — I259 Chronic ischemic heart disease, unspecified: Secondary | ICD-10-CM | POA: Insufficient documentation

## 2011-09-30 NOTE — Progress Notes (Signed)
Cardiac Rehab Medication Review by a Pharmacist  Does the patient  feel that his/her medications are working for him/her?  yes  Has the patient been experiencing any side effects to the medications prescribed?  Pradaxa causing acid reflux / GI upset / indigestion  Does the patient measure his/her own blood pressure or blood glucose at home?  Periodically checks BP (2x/month) SBP 120's / BP 60-70's mmHg; occasional lightheadedness upon quick standing  Does the patient have any problems obtaining medications due to transportation or finances?   no  Understanding of regimen: good Understanding of indications: good Potential of compliance: good  Pharmacist comments: Overall no major complaints other indigestion with pradaxa--may want to consider either rivaroxaban or apixiban if this issue persists.  Benjaman Pott, PharmD    09/30/2011   9:14 AM

## 2011-10-04 ENCOUNTER — Encounter (HOSPITAL_COMMUNITY): Payer: Self-pay

## 2011-10-04 ENCOUNTER — Encounter (HOSPITAL_COMMUNITY)
Admission: RE | Admit: 2011-10-04 | Discharge: 2011-10-04 | Disposition: A | Discharge status: Home / Self Care | Payer: Medicare Other | Source: Ambulatory Visit | Attending: Cardiology | Admitting: Cardiology

## 2011-10-04 ENCOUNTER — Ambulatory Visit (HOSPITAL_COMMUNITY): Payer: Medicare Other

## 2011-10-04 NOTE — Progress Notes (Signed)
Pt started cardiac rehab today.  Pt tolerated light exercise without difficulty. Pt asymptomatic.  Telemetry-atrial fibrillation, VSS. Pt oriented to exercise equipment and routine.  Understanding verbalized.  Will continue to monitor the patient throughout  the program.

## 2011-10-06 ENCOUNTER — Encounter (HOSPITAL_COMMUNITY)
Admission: RE | Admit: 2011-10-06 | Discharge: 2011-10-06 | Disposition: A | Discharge status: Home / Self Care | Payer: Medicare Other | Source: Ambulatory Visit | Attending: Cardiology | Admitting: Cardiology

## 2011-10-06 ENCOUNTER — Ambulatory Visit (HOSPITAL_COMMUNITY): Payer: Medicare Other

## 2011-10-07 NOTE — Progress Notes (Signed)
Richard Shannon 71 y.o. male       Nutrition Screen                                                                    YES  NO Do you live in a nursing home?  X   Do you eat out more than 3 times/week?    X If yes, how many times per week do you eat out?    Do you have food allergies?   X If yes, what are you allergic to?  Have you gained or lost more than 10 lbs without trying?               X If yes, how much weight have you lost and over what time period?    Do you want to lose weight?    X  If yes, what is a goal weight or amount of weight you would like to lose? 30 lb  Do you eat alone most of the time?   X   Do you eat less than 2 meals/day?  X If yes, how many meals do you eat?    Do you drink more than 3 alcohol drinks/day?  X If yes, how many drinks per day?   Are you having trouble with constipation? *  X If yes, what are you doing to help relieve constipation?  Do you have financial difficulties with buying food?*    X   Are you experiencing regular nausea/ vomiting?*     X   Do you have a poor appetite? *                                        X   Do you have trouble chewing/swallowing? *   X    Pt with diagnoses of:  X Stent/ PTCA X GERD          X Dyslipidemia  / HDL< 40 / LDL>70 / High TG      X %  Body fat >goal / Body Mass Index >25 X HTN / BP >120/80 X MI X   A1c >6 / CBG >126       Pt Risk Score   1       Diagnosis Risk Score  80       Total Risk Score   81                        X High Risk                Low Risk    HT: 69" Ht Readings from Last 1 Encounters:  09/30/11 5\' 9"  (1.753 m)    WT:   313.9 lb (142.7 kg) Wt Readings from Last 3 Encounters:  09/30/11 314 lb 9.5 oz (142.7 kg)  08/02/11 314 lb (142.429 kg)  05/27/11 324 lb (146.965 kg)     IBW 72.7 196%IBW BMI 46.5 44.8%body fat  Meds reviewed: vitamin C, Beta-carotene, Omega-3, Hydrochlorothiazide, MVI Past Medical History  Diagnosis Date  . PAF (paroxysmal atrial fibrillation)   . Obesity     .  Hypertension   . IHD (ischemic heart disease)     Remote cath in 1993 with rotablator the RCA and PCI to LCX. Repeat PCI to both vessels in 1993 due to restenosis. Last  cath in 2004 showing mild diffuse 3VD.  S/P NSTEMI in December 2012 with BMS to the prox LAD & distal RCA  . WPW (Wolff-Parkinson-White syndrome) 1996    WITH ABLATION AT [facility]   . Chronic anticoagulation     on Pradaxa  . Glucose intolerance (impaired glucose tolerance)     A1C 6.3 in December 2012  . Normal nuclear stress test March 2012  . Abnormal chest CT     Chronic; checked yearly.   . Sleep apnea, obstructive     on CPAP       Activity level: Pt is active  Wt goal: 290-302 lb ( 131.8-137.3 kg) Current tobacco use? No Food/Drug Interaction? No Labs:  Lipid Panel     Component Value Date/Time   CHOL 131 04/30/2011 0627   TRIG 64 04/30/2011 0627   HDL 44 04/30/2011 0627   CHOLHDL 3.0 04/30/2011 0627   VLDL 13 04/30/2011 0627   LDLCALC 74 04/30/2011 0627   Lab Results  Component Value Date   HGBA1C 6.3* 04/30/2011  05/01/11 Glucose 133  LDL goal: < 70      MI and > 2:      HTN, family h/o, > 72 yo male Estimated Daily Nutrition Needs for: ? wt loss  2050-2550 Kcal , Total Fat 55-70gm, Saturated Fat 15-19 gm, Trans Fat 2.0-2.5 gm,  Sodium less than 1500 mg

## 2011-10-08 ENCOUNTER — Ambulatory Visit (HOSPITAL_COMMUNITY): Payer: Medicare Other

## 2011-10-08 ENCOUNTER — Encounter (HOSPITAL_COMMUNITY)
Admission: RE | Admit: 2011-10-08 | Discharge: 2011-10-08 | Disposition: A | Discharge status: Home / Self Care | Payer: Medicare Other | Source: Ambulatory Visit | Attending: Cardiology | Admitting: Cardiology

## 2011-10-11 ENCOUNTER — Ambulatory Visit (HOSPITAL_COMMUNITY): Payer: Medicare Other

## 2011-10-11 ENCOUNTER — Encounter (HOSPITAL_COMMUNITY)
Admission: RE | Admit: 2011-10-11 | Discharge: 2011-10-11 | Disposition: A | Discharge status: Home / Self Care | Payer: Medicare Other | Source: Ambulatory Visit | Attending: Cardiology | Admitting: Cardiology

## 2011-10-13 ENCOUNTER — Encounter (HOSPITAL_COMMUNITY): Payer: Medicare Other

## 2011-10-13 ENCOUNTER — Ambulatory Visit (HOSPITAL_COMMUNITY): Payer: Medicare Other

## 2011-10-15 ENCOUNTER — Ambulatory Visit (HOSPITAL_COMMUNITY): Payer: Medicare Other

## 2011-10-15 ENCOUNTER — Encounter (HOSPITAL_COMMUNITY): Payer: Medicare Other

## 2011-10-18 ENCOUNTER — Ambulatory Visit (HOSPITAL_COMMUNITY): Payer: Medicare Other

## 2011-10-20 ENCOUNTER — Encounter (HOSPITAL_COMMUNITY)
Admission: RE | Admit: 2011-10-20 | Discharge: 2011-10-20 | Disposition: A | Discharge status: Home / Self Care | Payer: Medicare Other | Source: Ambulatory Visit | Attending: Cardiology | Admitting: Cardiology

## 2011-10-20 ENCOUNTER — Ambulatory Visit (HOSPITAL_COMMUNITY): Payer: Medicare Other

## 2011-10-22 ENCOUNTER — Ambulatory Visit (HOSPITAL_COMMUNITY): Payer: Medicare Other

## 2011-10-22 ENCOUNTER — Encounter (HOSPITAL_COMMUNITY): Payer: Medicare Other

## 2011-10-25 ENCOUNTER — Ambulatory Visit (HOSPITAL_COMMUNITY): Payer: Medicare Other

## 2011-10-25 ENCOUNTER — Encounter (HOSPITAL_COMMUNITY)
Admission: RE | Admit: 2011-10-25 | Discharge: 2011-10-25 | Disposition: A | Discharge status: Home / Self Care | Payer: Medicare Other | Source: Ambulatory Visit | Attending: Cardiology | Admitting: Cardiology

## 2011-10-25 DIAGNOSIS — G4733 Obstructive sleep apnea (adult) (pediatric): Secondary | ICD-10-CM | POA: Insufficient documentation

## 2011-10-25 DIAGNOSIS — I259 Chronic ischemic heart disease, unspecified: Secondary | ICD-10-CM | POA: Insufficient documentation

## 2011-10-25 DIAGNOSIS — I252 Old myocardial infarction: Secondary | ICD-10-CM | POA: Insufficient documentation

## 2011-10-25 DIAGNOSIS — Z5189 Encounter for other specified aftercare: Secondary | ICD-10-CM | POA: Insufficient documentation

## 2011-10-25 DIAGNOSIS — K219 Gastro-esophageal reflux disease without esophagitis: Secondary | ICD-10-CM | POA: Insufficient documentation

## 2011-10-25 DIAGNOSIS — E785 Hyperlipidemia, unspecified: Secondary | ICD-10-CM | POA: Insufficient documentation

## 2011-10-25 DIAGNOSIS — I10 Essential (primary) hypertension: Secondary | ICD-10-CM | POA: Insufficient documentation

## 2011-10-25 DIAGNOSIS — Z9861 Coronary angioplasty status: Secondary | ICD-10-CM | POA: Insufficient documentation

## 2011-10-27 ENCOUNTER — Encounter (HOSPITAL_COMMUNITY)
Admission: RE | Admit: 2011-10-27 | Discharge: 2011-10-27 | Disposition: A | Discharge status: Home / Self Care | Payer: Medicare Other | Source: Ambulatory Visit | Attending: Cardiology | Admitting: Cardiology

## 2011-10-27 ENCOUNTER — Ambulatory Visit (HOSPITAL_COMMUNITY): Payer: Medicare Other

## 2011-10-29 ENCOUNTER — Ambulatory Visit (HOSPITAL_COMMUNITY): Payer: Medicare Other

## 2011-10-29 ENCOUNTER — Encounter (HOSPITAL_COMMUNITY): Payer: Medicare Other

## 2011-11-01 ENCOUNTER — Ambulatory Visit (HOSPITAL_COMMUNITY): Payer: Medicare Other

## 2011-11-01 ENCOUNTER — Encounter (HOSPITAL_COMMUNITY)
Admission: RE | Admit: 2011-11-01 | Discharge: 2011-11-01 | Disposition: A | Discharge status: Home / Self Care | Payer: Medicare Other | Source: Ambulatory Visit | Attending: Cardiology | Admitting: Cardiology

## 2011-11-01 NOTE — Progress Notes (Signed)
Reviewed home exercise with pt today.  Pt plans to use treadmill and weight machines and do stretching at [facility] club for exercise.  Reviewed THR, pulse, RPE, sign and symptoms, NTG use, and when to call 911 or MD.  Pt voiced understanding. Electronically signed by Harriett Sine MS on Monday November 01 2011 at [id number]

## 2011-11-03 ENCOUNTER — Encounter (HOSPITAL_COMMUNITY)
Admission: RE | Admit: 2011-11-03 | Discharge: 2011-11-03 | Disposition: A | Discharge status: Home / Self Care | Payer: Medicare Other | Source: Ambulatory Visit | Attending: Cardiology | Admitting: Cardiology

## 2011-11-03 ENCOUNTER — Ambulatory Visit (HOSPITAL_COMMUNITY): Payer: Medicare Other

## 2011-11-03 NOTE — Progress Notes (Signed)
Richard Shannon 71 y.o. male Nutrition Note Spoke with pt.  Nutrition Plan, Nutrition Survey, and cholesterol goals reviewed with pt. Pt is following Step 2 of the Therapeutic Lifestyle Changes diet. Pt reports a long history of trying to lose wt. Pt states he has been on Opti-fast and did not lose wt until he got down to 900 kcal/d. Pt was able to lose 110# per pt, "but I was starving." Pt currently following a vegetarian/Dean Ornish style diet and is not losing wt. Weight loss tips discussed. Pt feels he needs to decrease his portion sizes. Pt is pre-diabetic. Pt states he has been pre-diabetic "for years now." Pt states he has returned to his 6 day/wk exercise routine and "nothing I do helps improve my pre-diabetes." Pt expressed understanding of information reviewed.  Nutrition Diagnosis   Food-and nutrition-related knowledge deficit related to lack of exposure to information as related to diagnosis of: ? CVD ? Pre-DM (A1c 6.3)    Obesity related to excessive energy intake as evidenced by a BMI of 46.5 Nutrition RX/ Estimated Daily Nutrition Needs for: wt loss  2050-2550 Kcal, 55-70 gm fat, 15-19 gm sat fat, 2.0-2.5 gm trans-fat, <1500 mg sodium  Nutrition Intervention   Pt's individual nutrition plan including cholesterol goals reviewed with pt.   Benefits of adopting Therapeutic Lifestyle Changes discussed when Medficts reviewed.   Pt to attend the Portion Distortion class   Pt to attend the  ? Nutrition I class                          ? Nutrition II class    Pt given handouts for: ? wt loss ? pre-DM    Continue client-centered nutrition education by RD, as part of interdisciplinary care. Goal(s)   Pt to identify food quantities necessary to achieve: ? wt loss to a goal wt of 290-302 lb (131.8-137.3 kg) at graduation from cardiac rehab.  Monitor and Evaluate progress toward nutrition goal with team.  Nutrition Risk: change from High to Moderate

## 2011-11-05 ENCOUNTER — Ambulatory Visit (HOSPITAL_COMMUNITY): Payer: Medicare Other

## 2011-11-05 ENCOUNTER — Encounter (HOSPITAL_COMMUNITY)
Admission: RE | Admit: 2011-11-05 | Discharge: 2011-11-05 | Disposition: A | Discharge status: Home / Self Care | Payer: Medicare Other | Source: Ambulatory Visit | Attending: Cardiology | Admitting: Cardiology

## 2011-11-08 ENCOUNTER — Ambulatory Visit (HOSPITAL_COMMUNITY): Payer: Medicare Other

## 2011-11-08 ENCOUNTER — Encounter (HOSPITAL_COMMUNITY)
Admission: RE | Admit: 2011-11-08 | Discharge: 2011-11-08 | Disposition: A | Discharge status: Home / Self Care | Payer: Medicare Other | Source: Ambulatory Visit | Attending: Cardiology | Admitting: Cardiology

## 2011-11-10 ENCOUNTER — Encounter (HOSPITAL_COMMUNITY)
Admission: RE | Admit: 2011-11-10 | Discharge: 2011-11-10 | Disposition: A | Discharge status: Home / Self Care | Payer: Medicare Other | Source: Ambulatory Visit | Attending: Cardiology | Admitting: Cardiology

## 2011-11-10 ENCOUNTER — Ambulatory Visit (HOSPITAL_COMMUNITY): Payer: Medicare Other

## 2011-11-12 ENCOUNTER — Encounter (HOSPITAL_COMMUNITY)
Admission: RE | Admit: 2011-11-12 | Discharge: 2011-11-12 | Disposition: A | Discharge status: Home / Self Care | Payer: Medicare Other | Source: Ambulatory Visit | Attending: Cardiology | Admitting: Cardiology

## 2011-11-12 ENCOUNTER — Ambulatory Visit (HOSPITAL_COMMUNITY): Payer: Medicare Other

## 2011-11-15 ENCOUNTER — Ambulatory Visit (HOSPITAL_COMMUNITY): Payer: Medicare Other

## 2011-11-15 ENCOUNTER — Encounter (HOSPITAL_COMMUNITY)
Admission: RE | Admit: 2011-11-15 | Discharge: 2011-11-15 | Disposition: A | Discharge status: Home / Self Care | Payer: Medicare Other | Source: Ambulatory Visit | Attending: Cardiology | Admitting: Cardiology

## 2011-11-17 ENCOUNTER — Encounter (HOSPITAL_COMMUNITY)
Admission: RE | Admit: 2011-11-17 | Discharge: 2011-11-17 | Disposition: A | Discharge status: Home / Self Care | Payer: Medicare Other | Source: Ambulatory Visit | Attending: Cardiology | Admitting: Cardiology

## 2011-11-17 ENCOUNTER — Ambulatory Visit (HOSPITAL_COMMUNITY): Payer: Medicare Other

## 2011-11-19 ENCOUNTER — Ambulatory Visit (HOSPITAL_COMMUNITY): Payer: Medicare Other

## 2011-11-19 ENCOUNTER — Encounter (HOSPITAL_COMMUNITY)
Admission: RE | Admit: 2011-11-19 | Discharge: 2011-11-19 | Disposition: A | Discharge status: Home / Self Care | Payer: Medicare Other | Source: Ambulatory Visit | Attending: Cardiology | Admitting: Cardiology

## 2011-11-22 ENCOUNTER — Encounter (HOSPITAL_COMMUNITY)
Admission: RE | Admit: 2011-11-22 | Discharge: 2011-11-22 | Disposition: A | Discharge status: Home / Self Care | Payer: Medicare Other | Source: Ambulatory Visit | Attending: Cardiology | Admitting: Cardiology

## 2011-11-22 ENCOUNTER — Ambulatory Visit (HOSPITAL_COMMUNITY): Payer: Medicare Other

## 2011-11-22 DIAGNOSIS — Z9861 Coronary angioplasty status: Secondary | ICD-10-CM | POA: Insufficient documentation

## 2011-11-22 DIAGNOSIS — I10 Essential (primary) hypertension: Secondary | ICD-10-CM | POA: Insufficient documentation

## 2011-11-22 DIAGNOSIS — E785 Hyperlipidemia, unspecified: Secondary | ICD-10-CM | POA: Insufficient documentation

## 2011-11-22 DIAGNOSIS — I252 Old myocardial infarction: Secondary | ICD-10-CM | POA: Insufficient documentation

## 2011-11-22 DIAGNOSIS — Z5189 Encounter for other specified aftercare: Secondary | ICD-10-CM | POA: Insufficient documentation

## 2011-11-22 DIAGNOSIS — I259 Chronic ischemic heart disease, unspecified: Secondary | ICD-10-CM | POA: Insufficient documentation

## 2011-11-22 DIAGNOSIS — G4733 Obstructive sleep apnea (adult) (pediatric): Secondary | ICD-10-CM | POA: Insufficient documentation

## 2011-11-22 DIAGNOSIS — K219 Gastro-esophageal reflux disease without esophagitis: Secondary | ICD-10-CM | POA: Insufficient documentation

## 2011-11-24 ENCOUNTER — Ambulatory Visit (HOSPITAL_COMMUNITY): Payer: Medicare Other

## 2011-11-24 ENCOUNTER — Encounter (HOSPITAL_COMMUNITY)
Admission: RE | Admit: 2011-11-24 | Discharge: 2011-11-24 | Disposition: A | Discharge status: Home / Self Care | Payer: Medicare Other | Source: Ambulatory Visit | Attending: Cardiology | Admitting: Cardiology

## 2011-11-26 ENCOUNTER — Ambulatory Visit (HOSPITAL_COMMUNITY): Payer: Medicare Other

## 2011-11-26 ENCOUNTER — Encounter (HOSPITAL_COMMUNITY)
Admission: RE | Admit: 2011-11-26 | Discharge: 2011-11-26 | Disposition: A | Discharge status: Home / Self Care | Payer: Medicare Other | Source: Ambulatory Visit | Attending: Cardiology | Admitting: Cardiology

## 2011-11-29 ENCOUNTER — Ambulatory Visit (HOSPITAL_COMMUNITY): Payer: Medicare Other

## 2011-11-29 ENCOUNTER — Encounter (HOSPITAL_COMMUNITY)
Admission: RE | Admit: 2011-11-29 | Discharge: 2011-11-29 | Disposition: A | Discharge status: Home / Self Care | Payer: Medicare Other | Source: Ambulatory Visit | Attending: Cardiology | Admitting: Cardiology

## 2011-12-01 ENCOUNTER — Ambulatory Visit (HOSPITAL_COMMUNITY): Payer: Medicare Other

## 2011-12-01 ENCOUNTER — Encounter (HOSPITAL_COMMUNITY)
Admission: RE | Admit: 2011-12-01 | Discharge: 2011-12-01 | Disposition: A | Discharge status: Home / Self Care | Payer: Medicare Other | Source: Ambulatory Visit | Attending: Cardiology | Admitting: Cardiology

## 2011-12-03 ENCOUNTER — Ambulatory Visit (HOSPITAL_COMMUNITY): Payer: Medicare Other

## 2011-12-03 ENCOUNTER — Encounter (HOSPITAL_COMMUNITY)
Admission: RE | Admit: 2011-12-03 | Discharge: 2011-12-03 | Disposition: A | Discharge status: Home / Self Care | Payer: Medicare Other | Source: Ambulatory Visit | Attending: Cardiology | Admitting: Cardiology

## 2011-12-06 ENCOUNTER — Encounter (HOSPITAL_COMMUNITY)
Admission: RE | Admit: 2011-12-06 | Discharge: 2011-12-06 | Disposition: A | Discharge status: Home / Self Care | Payer: Medicare Other | Source: Ambulatory Visit | Attending: Cardiology | Admitting: Cardiology

## 2011-12-06 ENCOUNTER — Ambulatory Visit (HOSPITAL_COMMUNITY): Payer: Medicare Other

## 2011-12-08 ENCOUNTER — Ambulatory Visit (HOSPITAL_COMMUNITY): Payer: Medicare Other

## 2011-12-08 ENCOUNTER — Encounter (HOSPITAL_COMMUNITY): Payer: Medicare Other

## 2011-12-10 ENCOUNTER — Encounter (HOSPITAL_COMMUNITY): Payer: Medicare Other

## 2011-12-10 ENCOUNTER — Ambulatory Visit (HOSPITAL_COMMUNITY): Payer: Medicare Other

## 2011-12-13 ENCOUNTER — Encounter (HOSPITAL_COMMUNITY)
Admission: RE | Admit: 2011-12-13 | Discharge: 2011-12-13 | Disposition: A | Discharge status: Home / Self Care | Payer: Medicare Other | Source: Ambulatory Visit | Attending: Cardiology | Admitting: Cardiology

## 2011-12-13 ENCOUNTER — Ambulatory Visit (HOSPITAL_COMMUNITY): Payer: Medicare Other

## 2011-12-15 ENCOUNTER — Ambulatory Visit (HOSPITAL_COMMUNITY): Payer: Medicare Other

## 2011-12-15 ENCOUNTER — Encounter (HOSPITAL_COMMUNITY)
Admission: RE | Admit: 2011-12-15 | Discharge: 2011-12-15 | Disposition: A | Discharge status: Home / Self Care | Payer: Medicare Other | Source: Ambulatory Visit | Attending: Cardiology | Admitting: Cardiology

## 2011-12-17 ENCOUNTER — Ambulatory Visit (HOSPITAL_COMMUNITY): Payer: Medicare Other

## 2011-12-17 ENCOUNTER — Encounter (HOSPITAL_COMMUNITY): Payer: Medicare Other

## 2011-12-20 ENCOUNTER — Ambulatory Visit (HOSPITAL_COMMUNITY): Payer: Medicare Other

## 2011-12-20 ENCOUNTER — Encounter (HOSPITAL_COMMUNITY): Payer: Medicare Other

## 2011-12-22 ENCOUNTER — Encounter (HOSPITAL_COMMUNITY)
Admission: RE | Admit: 2011-12-22 | Discharge: 2011-12-22 | Disposition: A | Discharge status: Home / Self Care | Payer: Medicare Other | Source: Ambulatory Visit | Attending: Cardiology | Admitting: Cardiology

## 2011-12-22 ENCOUNTER — Ambulatory Visit (HOSPITAL_COMMUNITY): Payer: Medicare Other

## 2011-12-24 ENCOUNTER — Ambulatory Visit (HOSPITAL_COMMUNITY): Payer: Medicare Other

## 2011-12-24 ENCOUNTER — Encounter (HOSPITAL_COMMUNITY)
Admission: RE | Admit: 2011-12-24 | Discharge: 2011-12-24 | Disposition: A | Discharge status: Home / Self Care | Payer: Medicare Other | Source: Ambulatory Visit | Attending: Cardiology | Admitting: Cardiology

## 2011-12-24 DIAGNOSIS — K219 Gastro-esophageal reflux disease without esophagitis: Secondary | ICD-10-CM | POA: Insufficient documentation

## 2011-12-24 DIAGNOSIS — I259 Chronic ischemic heart disease, unspecified: Secondary | ICD-10-CM | POA: Insufficient documentation

## 2011-12-24 DIAGNOSIS — G4733 Obstructive sleep apnea (adult) (pediatric): Secondary | ICD-10-CM | POA: Insufficient documentation

## 2011-12-24 DIAGNOSIS — I10 Essential (primary) hypertension: Secondary | ICD-10-CM | POA: Insufficient documentation

## 2011-12-24 DIAGNOSIS — I252 Old myocardial infarction: Secondary | ICD-10-CM | POA: Insufficient documentation

## 2011-12-24 DIAGNOSIS — E785 Hyperlipidemia, unspecified: Secondary | ICD-10-CM | POA: Insufficient documentation

## 2011-12-24 DIAGNOSIS — Z5189 Encounter for other specified aftercare: Secondary | ICD-10-CM | POA: Insufficient documentation

## 2011-12-24 DIAGNOSIS — Z9861 Coronary angioplasty status: Secondary | ICD-10-CM | POA: Insufficient documentation

## 2011-12-27 ENCOUNTER — Encounter (HOSPITAL_COMMUNITY)
Admission: RE | Admit: 2011-12-27 | Discharge: 2011-12-27 | Disposition: A | Discharge status: Home / Self Care | Payer: Medicare Other | Source: Ambulatory Visit | Attending: Cardiology | Admitting: Cardiology

## 2011-12-27 ENCOUNTER — Ambulatory Visit (HOSPITAL_COMMUNITY): Payer: Medicare Other

## 2011-12-29 ENCOUNTER — Ambulatory Visit (HOSPITAL_COMMUNITY): Payer: Medicare Other

## 2011-12-29 ENCOUNTER — Encounter (HOSPITAL_COMMUNITY)
Admission: RE | Admit: 2011-12-29 | Discharge: 2011-12-29 | Disposition: A | Discharge status: Home / Self Care | Payer: Medicare Other | Source: Ambulatory Visit | Attending: Cardiology | Admitting: Cardiology

## 2011-12-31 ENCOUNTER — Encounter (HOSPITAL_COMMUNITY)
Admission: RE | Admit: 2011-12-31 | Discharge: 2011-12-31 | Disposition: A | Discharge status: Home / Self Care | Payer: Medicare Other | Source: Ambulatory Visit | Attending: Cardiology | Admitting: Cardiology

## 2011-12-31 ENCOUNTER — Ambulatory Visit (HOSPITAL_COMMUNITY): Payer: Medicare Other

## 2012-01-03 ENCOUNTER — Encounter (HOSPITAL_COMMUNITY)
Admission: RE | Admit: 2012-01-03 | Discharge: 2012-01-03 | Disposition: A | Discharge status: Home / Self Care | Payer: Medicare Other | Source: Ambulatory Visit | Attending: Cardiology | Admitting: Cardiology

## 2012-01-03 ENCOUNTER — Ambulatory Visit (HOSPITAL_COMMUNITY): Payer: Medicare Other

## 2012-01-05 ENCOUNTER — Ambulatory Visit (HOSPITAL_COMMUNITY): Payer: Medicare Other

## 2012-01-05 ENCOUNTER — Encounter (HOSPITAL_COMMUNITY): Payer: Medicare Other

## 2012-01-07 ENCOUNTER — Ambulatory Visit (HOSPITAL_COMMUNITY): Payer: Medicare Other

## 2012-01-07 ENCOUNTER — Encounter (HOSPITAL_COMMUNITY): Payer: Medicare Other

## 2012-01-10 ENCOUNTER — Encounter (HOSPITAL_COMMUNITY)
Admission: RE | Admit: 2012-01-10 | Discharge: 2012-01-10 | Disposition: A | Discharge status: Home / Self Care | Payer: Medicare Other | Source: Ambulatory Visit | Attending: Cardiology | Admitting: Cardiology

## 2012-01-12 ENCOUNTER — Encounter (HOSPITAL_COMMUNITY)
Admission: RE | Admit: 2012-01-12 | Discharge: 2012-01-12 | Disposition: A | Discharge status: Home / Self Care | Payer: Medicare Other | Source: Ambulatory Visit | Attending: Cardiology | Admitting: Cardiology

## 2012-01-14 ENCOUNTER — Encounter (HOSPITAL_COMMUNITY)
Admission: RE | Admit: 2012-01-14 | Discharge: 2012-01-14 | Disposition: A | Discharge status: Home / Self Care | Payer: Medicare Other | Source: Ambulatory Visit | Attending: Cardiology | Admitting: Cardiology

## 2012-01-17 ENCOUNTER — Encounter (HOSPITAL_COMMUNITY): Payer: Medicare Other

## 2012-01-19 ENCOUNTER — Encounter (HOSPITAL_COMMUNITY): Payer: Medicare Other

## 2012-01-21 ENCOUNTER — Encounter (HOSPITAL_COMMUNITY)
Admission: RE | Admit: 2012-01-21 | Discharge: 2012-01-21 | Disposition: A | Discharge status: Home / Self Care | Payer: Medicare Other | Source: Ambulatory Visit | Attending: Cardiology | Admitting: Cardiology

## 2012-01-26 ENCOUNTER — Encounter (HOSPITAL_COMMUNITY)
Admission: RE | Admit: 2012-01-26 | Discharge: 2012-01-26 | Disposition: A | Discharge status: Home / Self Care | Payer: Medicare Other | Source: Ambulatory Visit | Attending: Cardiology | Admitting: Cardiology

## 2012-01-26 DIAGNOSIS — E785 Hyperlipidemia, unspecified: Secondary | ICD-10-CM | POA: Insufficient documentation

## 2012-01-26 DIAGNOSIS — I10 Essential (primary) hypertension: Secondary | ICD-10-CM | POA: Insufficient documentation

## 2012-01-26 DIAGNOSIS — G4733 Obstructive sleep apnea (adult) (pediatric): Secondary | ICD-10-CM | POA: Insufficient documentation

## 2012-01-26 DIAGNOSIS — I259 Chronic ischemic heart disease, unspecified: Secondary | ICD-10-CM | POA: Insufficient documentation

## 2012-01-26 DIAGNOSIS — K219 Gastro-esophageal reflux disease without esophagitis: Secondary | ICD-10-CM | POA: Insufficient documentation

## 2012-01-26 DIAGNOSIS — Z9861 Coronary angioplasty status: Secondary | ICD-10-CM | POA: Insufficient documentation

## 2012-01-26 DIAGNOSIS — I252 Old myocardial infarction: Secondary | ICD-10-CM | POA: Insufficient documentation

## 2012-01-26 DIAGNOSIS — Z5189 Encounter for other specified aftercare: Secondary | ICD-10-CM | POA: Insufficient documentation

## 2012-01-28 ENCOUNTER — Encounter (HOSPITAL_COMMUNITY): Payer: Medicare Other

## 2012-01-31 ENCOUNTER — Encounter (HOSPITAL_COMMUNITY): Payer: Medicare Other

## 2012-02-02 ENCOUNTER — Encounter (HOSPITAL_COMMUNITY): Payer: Medicare Other

## 2012-02-04 ENCOUNTER — Encounter (HOSPITAL_COMMUNITY): Payer: Medicare Other

## 2012-02-16 ENCOUNTER — Encounter (HOSPITAL_COMMUNITY)
Admission: RE | Admit: 2012-02-16 | Discharge: 2012-02-16 | Disposition: A | Discharge status: Home / Self Care | Payer: Self-pay | Source: Ambulatory Visit | Attending: Cardiology | Admitting: Cardiology

## 2012-02-16 DIAGNOSIS — I10 Essential (primary) hypertension: Secondary | ICD-10-CM | POA: Insufficient documentation

## 2012-02-16 DIAGNOSIS — Z9861 Coronary angioplasty status: Secondary | ICD-10-CM | POA: Insufficient documentation

## 2012-02-16 DIAGNOSIS — G4733 Obstructive sleep apnea (adult) (pediatric): Secondary | ICD-10-CM | POA: Insufficient documentation

## 2012-02-16 DIAGNOSIS — E785 Hyperlipidemia, unspecified: Secondary | ICD-10-CM | POA: Insufficient documentation

## 2012-02-16 DIAGNOSIS — Z5189 Encounter for other specified aftercare: Secondary | ICD-10-CM | POA: Insufficient documentation

## 2012-02-16 DIAGNOSIS — K219 Gastro-esophageal reflux disease without esophagitis: Secondary | ICD-10-CM | POA: Insufficient documentation

## 2012-02-16 DIAGNOSIS — I259 Chronic ischemic heart disease, unspecified: Secondary | ICD-10-CM | POA: Insufficient documentation

## 2012-02-16 DIAGNOSIS — I252 Old myocardial infarction: Secondary | ICD-10-CM | POA: Insufficient documentation

## 2012-02-17 ENCOUNTER — Encounter (HOSPITAL_COMMUNITY)
Admission: RE | Admit: 2012-02-17 | Discharge: 2012-02-17 | Disposition: A | Discharge status: Home / Self Care | Payer: Self-pay | Source: Ambulatory Visit | Attending: Cardiology | Admitting: Cardiology

## 2012-02-22 ENCOUNTER — Encounter (HOSPITAL_COMMUNITY)
Admission: RE | Admit: 2012-02-22 | Discharge: 2012-02-22 | Disposition: A | Discharge status: Home / Self Care | Payer: Self-pay | Source: Ambulatory Visit | Attending: Cardiology | Admitting: Cardiology

## 2012-02-22 DIAGNOSIS — I10 Essential (primary) hypertension: Secondary | ICD-10-CM | POA: Insufficient documentation

## 2012-02-22 DIAGNOSIS — G4733 Obstructive sleep apnea (adult) (pediatric): Secondary | ICD-10-CM | POA: Insufficient documentation

## 2012-02-22 DIAGNOSIS — I259 Chronic ischemic heart disease, unspecified: Secondary | ICD-10-CM | POA: Insufficient documentation

## 2012-02-22 DIAGNOSIS — K219 Gastro-esophageal reflux disease without esophagitis: Secondary | ICD-10-CM | POA: Insufficient documentation

## 2012-02-22 DIAGNOSIS — Z5189 Encounter for other specified aftercare: Secondary | ICD-10-CM | POA: Insufficient documentation

## 2012-02-22 DIAGNOSIS — E785 Hyperlipidemia, unspecified: Secondary | ICD-10-CM | POA: Insufficient documentation

## 2012-02-22 DIAGNOSIS — Z9861 Coronary angioplasty status: Secondary | ICD-10-CM | POA: Insufficient documentation

## 2012-02-22 DIAGNOSIS — I252 Old myocardial infarction: Secondary | ICD-10-CM | POA: Insufficient documentation

## 2012-02-23 ENCOUNTER — Encounter (HOSPITAL_COMMUNITY)
Admission: RE | Admit: 2012-02-23 | Discharge: 2012-02-23 | Disposition: A | Discharge status: Home / Self Care | Payer: Self-pay | Source: Ambulatory Visit | Attending: Cardiology | Admitting: Cardiology

## 2012-02-24 ENCOUNTER — Encounter (HOSPITAL_COMMUNITY)
Admission: RE | Admit: 2012-02-24 | Discharge: 2012-02-24 | Disposition: A | Discharge status: Home / Self Care | Payer: Self-pay | Source: Ambulatory Visit | Attending: Cardiology | Admitting: Cardiology

## 2012-02-29 ENCOUNTER — Encounter (HOSPITAL_COMMUNITY)
Admission: RE | Admit: 2012-02-29 | Discharge: 2012-02-29 | Disposition: A | Discharge status: Home / Self Care | Payer: Self-pay | Source: Ambulatory Visit | Attending: Cardiology | Admitting: Cardiology

## 2012-03-01 ENCOUNTER — Encounter (HOSPITAL_COMMUNITY)
Admission: RE | Admit: 2012-03-01 | Discharge: 2012-03-01 | Disposition: A | Discharge status: Home / Self Care | Payer: Self-pay | Source: Ambulatory Visit | Attending: Cardiology | Admitting: Cardiology

## 2012-03-02 ENCOUNTER — Encounter (HOSPITAL_COMMUNITY)
Admission: RE | Admit: 2012-03-02 | Discharge: 2012-03-02 | Disposition: A | Discharge status: Home / Self Care | Payer: Self-pay | Source: Ambulatory Visit | Attending: Cardiology | Admitting: Cardiology

## 2012-03-07 ENCOUNTER — Encounter (HOSPITAL_COMMUNITY)
Admission: RE | Admit: 2012-03-07 | Discharge: 2012-03-07 | Disposition: A | Discharge status: Home / Self Care | Payer: Self-pay | Source: Ambulatory Visit | Attending: Cardiology | Admitting: Cardiology

## 2012-03-08 ENCOUNTER — Encounter (HOSPITAL_COMMUNITY): Payer: Self-pay

## 2012-03-09 ENCOUNTER — Encounter (HOSPITAL_COMMUNITY): Admission: RE | Admit: 2012-03-09 | Payer: Self-pay | Source: Ambulatory Visit

## 2012-03-10 ENCOUNTER — Encounter (HOSPITAL_COMMUNITY)
Admission: RE | Admit: 2012-03-10 | Discharge: 2012-03-10 | Disposition: A | Discharge status: Home / Self Care | Payer: Self-pay | Source: Ambulatory Visit | Attending: Cardiology | Admitting: Cardiology

## 2012-03-14 ENCOUNTER — Encounter (HOSPITAL_COMMUNITY)
Admission: RE | Admit: 2012-03-14 | Discharge: 2012-03-14 | Disposition: A | Discharge status: Home / Self Care | Payer: Self-pay | Source: Ambulatory Visit | Attending: Cardiology | Admitting: Cardiology

## 2012-03-15 ENCOUNTER — Encounter (HOSPITAL_COMMUNITY): Payer: Self-pay

## 2012-03-16 ENCOUNTER — Encounter (HOSPITAL_COMMUNITY)
Admission: RE | Admit: 2012-03-16 | Discharge: 2012-03-16 | Disposition: A | Discharge status: Home / Self Care | Payer: Self-pay | Source: Ambulatory Visit | Attending: Cardiology | Admitting: Cardiology

## 2012-03-21 ENCOUNTER — Encounter (HOSPITAL_COMMUNITY)
Admission: RE | Admit: 2012-03-21 | Discharge: 2012-03-21 | Disposition: A | Discharge status: Home / Self Care | Payer: Self-pay | Source: Ambulatory Visit | Attending: Cardiology | Admitting: Cardiology

## 2012-03-22 ENCOUNTER — Encounter (HOSPITAL_COMMUNITY): Payer: Self-pay

## 2012-03-23 ENCOUNTER — Encounter (HOSPITAL_COMMUNITY)
Admission: RE | Admit: 2012-03-23 | Discharge: 2012-03-23 | Disposition: A | Discharge status: Home / Self Care | Payer: Self-pay | Source: Ambulatory Visit | Attending: Cardiology | Admitting: Cardiology

## 2012-03-24 ENCOUNTER — Encounter (HOSPITAL_COMMUNITY)
Admission: RE | Admit: 2012-03-24 | Discharge: 2012-03-24 | Disposition: A | Discharge status: Home / Self Care | Payer: Self-pay | Source: Ambulatory Visit | Attending: Cardiology | Admitting: Cardiology

## 2012-03-24 DIAGNOSIS — I10 Essential (primary) hypertension: Secondary | ICD-10-CM | POA: Insufficient documentation

## 2012-03-24 DIAGNOSIS — I252 Old myocardial infarction: Secondary | ICD-10-CM | POA: Insufficient documentation

## 2012-03-24 DIAGNOSIS — Z9861 Coronary angioplasty status: Secondary | ICD-10-CM | POA: Insufficient documentation

## 2012-03-24 DIAGNOSIS — G4733 Obstructive sleep apnea (adult) (pediatric): Secondary | ICD-10-CM | POA: Insufficient documentation

## 2012-03-24 DIAGNOSIS — I259 Chronic ischemic heart disease, unspecified: Secondary | ICD-10-CM | POA: Insufficient documentation

## 2012-03-24 DIAGNOSIS — Z5189 Encounter for other specified aftercare: Secondary | ICD-10-CM | POA: Insufficient documentation

## 2012-03-24 DIAGNOSIS — K219 Gastro-esophageal reflux disease without esophagitis: Secondary | ICD-10-CM | POA: Insufficient documentation

## 2012-03-24 DIAGNOSIS — E785 Hyperlipidemia, unspecified: Secondary | ICD-10-CM | POA: Insufficient documentation

## 2012-03-28 ENCOUNTER — Encounter (HOSPITAL_COMMUNITY)
Admission: RE | Admit: 2012-03-28 | Discharge: 2012-03-28 | Disposition: A | Discharge status: Home / Self Care | Payer: Self-pay | Source: Ambulatory Visit | Attending: Cardiology | Admitting: Cardiology

## 2012-03-29 ENCOUNTER — Encounter (HOSPITAL_COMMUNITY)
Admission: RE | Admit: 2012-03-29 | Discharge: 2012-03-29 | Disposition: A | Discharge status: Home / Self Care | Payer: Self-pay | Source: Ambulatory Visit | Attending: Cardiology | Admitting: Cardiology

## 2012-03-30 ENCOUNTER — Encounter (HOSPITAL_COMMUNITY)
Admission: RE | Admit: 2012-03-30 | Discharge: 2012-03-30 | Disposition: A | Discharge status: Home / Self Care | Payer: Self-pay | Source: Ambulatory Visit | Attending: Cardiology | Admitting: Cardiology

## 2012-04-04 ENCOUNTER — Encounter (HOSPITAL_COMMUNITY): Payer: Self-pay

## 2012-04-05 ENCOUNTER — Encounter (HOSPITAL_COMMUNITY): Payer: Self-pay

## 2012-04-06 ENCOUNTER — Encounter (HOSPITAL_COMMUNITY): Payer: Self-pay

## 2012-04-11 ENCOUNTER — Encounter (HOSPITAL_COMMUNITY): Payer: Self-pay

## 2012-04-12 ENCOUNTER — Encounter (HOSPITAL_COMMUNITY): Payer: Self-pay

## 2012-04-13 ENCOUNTER — Encounter (HOSPITAL_COMMUNITY): Payer: Self-pay

## 2012-04-18 ENCOUNTER — Encounter (HOSPITAL_COMMUNITY): Payer: Self-pay

## 2012-04-19 ENCOUNTER — Encounter (HOSPITAL_COMMUNITY): Payer: Self-pay

## 2012-04-25 ENCOUNTER — Encounter (HOSPITAL_COMMUNITY)
Admission: RE | Admit: 2012-04-25 | Discharge: 2012-04-25 | Disposition: A | Discharge status: Home / Self Care | Payer: Self-pay | Source: Ambulatory Visit | Attending: Cardiology | Admitting: Cardiology

## 2012-04-25 DIAGNOSIS — E785 Hyperlipidemia, unspecified: Secondary | ICD-10-CM | POA: Insufficient documentation

## 2012-04-25 DIAGNOSIS — I252 Old myocardial infarction: Secondary | ICD-10-CM | POA: Insufficient documentation

## 2012-04-25 DIAGNOSIS — I10 Essential (primary) hypertension: Secondary | ICD-10-CM | POA: Insufficient documentation

## 2012-04-25 DIAGNOSIS — Z5189 Encounter for other specified aftercare: Secondary | ICD-10-CM | POA: Insufficient documentation

## 2012-04-25 DIAGNOSIS — I259 Chronic ischemic heart disease, unspecified: Secondary | ICD-10-CM | POA: Insufficient documentation

## 2012-04-25 DIAGNOSIS — Z9861 Coronary angioplasty status: Secondary | ICD-10-CM | POA: Insufficient documentation

## 2012-04-25 DIAGNOSIS — K219 Gastro-esophageal reflux disease without esophagitis: Secondary | ICD-10-CM | POA: Insufficient documentation

## 2012-04-25 DIAGNOSIS — G4733 Obstructive sleep apnea (adult) (pediatric): Secondary | ICD-10-CM | POA: Insufficient documentation

## 2012-04-26 ENCOUNTER — Encounter (HOSPITAL_COMMUNITY)
Admission: RE | Admit: 2012-04-26 | Discharge: 2012-04-26 | Disposition: A | Discharge status: Home / Self Care | Payer: Self-pay | Source: Ambulatory Visit | Attending: Cardiology | Admitting: Cardiology

## 2012-04-27 ENCOUNTER — Encounter (HOSPITAL_COMMUNITY): Payer: Self-pay

## 2012-04-28 ENCOUNTER — Encounter (HOSPITAL_COMMUNITY)
Admission: RE | Admit: 2012-04-28 | Discharge: 2012-04-28 | Disposition: A | Discharge status: Home / Self Care | Payer: Self-pay | Source: Ambulatory Visit | Attending: Cardiology | Admitting: Cardiology

## 2012-05-02 ENCOUNTER — Encounter (HOSPITAL_COMMUNITY)
Admission: RE | Admit: 2012-05-02 | Discharge: 2012-05-02 | Disposition: A | Discharge status: Home / Self Care | Payer: Self-pay | Source: Ambulatory Visit | Attending: Cardiology | Admitting: Cardiology

## 2012-05-03 ENCOUNTER — Encounter (HOSPITAL_COMMUNITY)
Admission: RE | Admit: 2012-05-03 | Discharge: 2012-05-03 | Disposition: A | Discharge status: Home / Self Care | Payer: Self-pay | Source: Ambulatory Visit | Attending: Cardiology | Admitting: Cardiology

## 2012-05-04 ENCOUNTER — Encounter (HOSPITAL_COMMUNITY)
Admission: RE | Admit: 2012-05-04 | Discharge: 2012-05-04 | Disposition: A | Discharge status: Home / Self Care | Payer: Self-pay | Source: Ambulatory Visit | Attending: Cardiology | Admitting: Cardiology

## 2012-05-09 ENCOUNTER — Encounter (HOSPITAL_COMMUNITY)
Admission: RE | Admit: 2012-05-09 | Discharge: 2012-05-09 | Disposition: A | Discharge status: Home / Self Care | Payer: Self-pay | Source: Ambulatory Visit | Attending: Cardiology | Admitting: Cardiology

## 2012-05-10 ENCOUNTER — Encounter (HOSPITAL_COMMUNITY)
Admission: RE | Admit: 2012-05-10 | Discharge: 2012-05-10 | Disposition: A | Discharge status: Home / Self Care | Payer: Self-pay | Source: Ambulatory Visit | Attending: Cardiology | Admitting: Cardiology

## 2012-05-11 ENCOUNTER — Encounter (HOSPITAL_COMMUNITY): Payer: Self-pay

## 2012-05-16 ENCOUNTER — Encounter (HOSPITAL_COMMUNITY): Payer: Self-pay

## 2012-05-18 ENCOUNTER — Encounter (HOSPITAL_COMMUNITY): Payer: Self-pay

## 2012-05-22 ENCOUNTER — Encounter (HOSPITAL_COMMUNITY)
Admission: RE | Admit: 2012-05-22 | Discharge: 2012-05-22 | Disposition: A | Discharge status: Home / Self Care | Payer: Self-pay | Source: Ambulatory Visit | Attending: Cardiology | Admitting: Cardiology

## 2012-05-23 ENCOUNTER — Encounter (HOSPITAL_COMMUNITY): Payer: Self-pay

## 2012-05-25 ENCOUNTER — Encounter (HOSPITAL_COMMUNITY)
Admission: RE | Admit: 2012-05-25 | Discharge: 2012-05-25 | Disposition: A | Discharge status: Home / Self Care | Payer: Self-pay | Source: Ambulatory Visit | Attending: Cardiology | Admitting: Cardiology

## 2012-05-25 DIAGNOSIS — K219 Gastro-esophageal reflux disease without esophagitis: Secondary | ICD-10-CM | POA: Insufficient documentation

## 2012-05-25 DIAGNOSIS — Z9861 Coronary angioplasty status: Secondary | ICD-10-CM | POA: Insufficient documentation

## 2012-05-25 DIAGNOSIS — I252 Old myocardial infarction: Secondary | ICD-10-CM | POA: Insufficient documentation

## 2012-05-25 DIAGNOSIS — E785 Hyperlipidemia, unspecified: Secondary | ICD-10-CM | POA: Insufficient documentation

## 2012-05-25 DIAGNOSIS — Z5189 Encounter for other specified aftercare: Secondary | ICD-10-CM | POA: Insufficient documentation

## 2012-05-25 DIAGNOSIS — I259 Chronic ischemic heart disease, unspecified: Secondary | ICD-10-CM | POA: Insufficient documentation

## 2012-05-25 DIAGNOSIS — I10 Essential (primary) hypertension: Secondary | ICD-10-CM | POA: Insufficient documentation

## 2012-05-25 DIAGNOSIS — G4733 Obstructive sleep apnea (adult) (pediatric): Secondary | ICD-10-CM | POA: Insufficient documentation

## 2012-05-26 ENCOUNTER — Encounter (HOSPITAL_COMMUNITY)
Admission: RE | Admit: 2012-05-26 | Discharge: 2012-05-26 | Disposition: A | Discharge status: Home / Self Care | Payer: Self-pay | Source: Ambulatory Visit | Attending: Cardiology | Admitting: Cardiology

## 2012-05-29 ENCOUNTER — Encounter (HOSPITAL_COMMUNITY)
Admission: RE | Admit: 2012-05-29 | Discharge: 2012-05-29 | Disposition: A | Discharge status: Home / Self Care | Payer: Self-pay | Source: Ambulatory Visit | Attending: Cardiology | Admitting: Cardiology

## 2012-05-30 ENCOUNTER — Encounter (HOSPITAL_COMMUNITY): Payer: Self-pay

## 2012-05-31 ENCOUNTER — Encounter (HOSPITAL_COMMUNITY)
Admission: RE | Admit: 2012-05-31 | Discharge: 2012-05-31 | Disposition: A | Discharge status: Home / Self Care | Payer: Self-pay | Source: Ambulatory Visit | Attending: Cardiology | Admitting: Cardiology

## 2012-06-01 ENCOUNTER — Encounter (HOSPITAL_COMMUNITY): Payer: Self-pay

## 2012-06-05 ENCOUNTER — Encounter (HOSPITAL_COMMUNITY)
Admission: RE | Admit: 2012-06-05 | Discharge: 2012-06-05 | Disposition: A | Discharge status: Home / Self Care | Payer: Self-pay | Source: Ambulatory Visit | Attending: Cardiology | Admitting: Cardiology

## 2012-06-06 ENCOUNTER — Encounter (HOSPITAL_COMMUNITY)
Admission: RE | Admit: 2012-06-06 | Discharge: 2012-06-06 | Disposition: A | Discharge status: Home / Self Care | Payer: Self-pay | Source: Ambulatory Visit | Attending: Cardiology | Admitting: Cardiology

## 2012-06-07 ENCOUNTER — Encounter (HOSPITAL_COMMUNITY): Payer: Self-pay

## 2012-06-08 ENCOUNTER — Encounter (HOSPITAL_COMMUNITY)
Admission: RE | Admit: 2012-06-08 | Discharge: 2012-06-08 | Disposition: A | Discharge status: Home / Self Care | Payer: Self-pay | Source: Ambulatory Visit | Attending: Cardiology | Admitting: Cardiology

## 2012-06-13 ENCOUNTER — Encounter (HOSPITAL_COMMUNITY): Payer: Self-pay

## 2012-06-14 ENCOUNTER — Encounter (HOSPITAL_COMMUNITY): Payer: Self-pay

## 2012-06-15 ENCOUNTER — Encounter (HOSPITAL_COMMUNITY): Payer: Self-pay

## 2012-06-20 ENCOUNTER — Encounter (HOSPITAL_COMMUNITY): Payer: Self-pay

## 2012-06-21 ENCOUNTER — Encounter (HOSPITAL_COMMUNITY): Payer: Self-pay

## 2012-06-22 ENCOUNTER — Encounter (HOSPITAL_COMMUNITY): Payer: Self-pay

## 2012-06-27 ENCOUNTER — Encounter (HOSPITAL_COMMUNITY): Payer: Medicare Other | Attending: Cardiology

## 2012-06-27 DIAGNOSIS — E785 Hyperlipidemia, unspecified: Secondary | ICD-10-CM | POA: Insufficient documentation

## 2012-06-27 DIAGNOSIS — I10 Essential (primary) hypertension: Secondary | ICD-10-CM | POA: Insufficient documentation

## 2012-06-27 DIAGNOSIS — I259 Chronic ischemic heart disease, unspecified: Secondary | ICD-10-CM | POA: Insufficient documentation

## 2012-06-27 DIAGNOSIS — Z5189 Encounter for other specified aftercare: Secondary | ICD-10-CM | POA: Insufficient documentation

## 2012-06-27 DIAGNOSIS — K219 Gastro-esophageal reflux disease without esophagitis: Secondary | ICD-10-CM | POA: Insufficient documentation

## 2012-06-27 DIAGNOSIS — G4733 Obstructive sleep apnea (adult) (pediatric): Secondary | ICD-10-CM | POA: Insufficient documentation

## 2012-06-27 DIAGNOSIS — I252 Old myocardial infarction: Secondary | ICD-10-CM | POA: Insufficient documentation

## 2012-06-27 DIAGNOSIS — Z9861 Coronary angioplasty status: Secondary | ICD-10-CM | POA: Insufficient documentation

## 2012-06-28 ENCOUNTER — Encounter (HOSPITAL_COMMUNITY): Payer: Medicare Other

## 2012-06-29 ENCOUNTER — Encounter (HOSPITAL_COMMUNITY): Payer: Medicare Other

## 2012-07-04 ENCOUNTER — Encounter (HOSPITAL_COMMUNITY): Payer: Medicare Other

## 2012-07-05 ENCOUNTER — Encounter (HOSPITAL_COMMUNITY): Payer: Medicare Other

## 2012-07-06 ENCOUNTER — Encounter (HOSPITAL_COMMUNITY): Payer: Medicare Other

## 2012-07-11 ENCOUNTER — Encounter (HOSPITAL_COMMUNITY): Payer: Medicare Other

## 2012-07-12 ENCOUNTER — Encounter (HOSPITAL_COMMUNITY): Payer: Medicare Other

## 2012-07-13 ENCOUNTER — Encounter (HOSPITAL_COMMUNITY): Payer: Medicare Other

## 2012-07-18 ENCOUNTER — Encounter (HOSPITAL_COMMUNITY): Payer: Medicare Other

## 2012-07-19 ENCOUNTER — Encounter (HOSPITAL_COMMUNITY): Payer: Medicare Other

## 2012-07-20 ENCOUNTER — Encounter (HOSPITAL_COMMUNITY): Payer: Medicare Other

## 2012-07-25 ENCOUNTER — Encounter (HOSPITAL_COMMUNITY): Payer: Medicare Other

## 2012-07-26 ENCOUNTER — Encounter (HOSPITAL_COMMUNITY): Payer: Medicare Other

## 2012-07-27 ENCOUNTER — Encounter (HOSPITAL_COMMUNITY): Payer: Medicare Other

## 2012-08-01 ENCOUNTER — Encounter (HOSPITAL_COMMUNITY): Payer: Medicare Other

## 2012-08-02 ENCOUNTER — Encounter (HOSPITAL_COMMUNITY): Payer: Medicare Other

## 2012-08-03 ENCOUNTER — Encounter (HOSPITAL_COMMUNITY): Payer: Medicare Other

## 2012-08-07 ENCOUNTER — Ambulatory Visit: Payer: Medicare Other | Admitting: Internal Medicine

## 2012-08-08 ENCOUNTER — Encounter (HOSPITAL_COMMUNITY): Payer: Medicare Other

## 2012-08-09 ENCOUNTER — Encounter (HOSPITAL_COMMUNITY): Payer: Medicare Other

## 2012-08-10 ENCOUNTER — Encounter (HOSPITAL_COMMUNITY): Payer: Medicare Other

## 2012-08-15 ENCOUNTER — Encounter (HOSPITAL_COMMUNITY): Payer: Medicare Other

## 2012-08-16 ENCOUNTER — Encounter (HOSPITAL_COMMUNITY): Payer: Medicare Other

## 2012-08-17 ENCOUNTER — Encounter (HOSPITAL_COMMUNITY): Payer: Medicare Other

## 2012-08-22 ENCOUNTER — Encounter (HOSPITAL_COMMUNITY): Payer: Medicare Other

## 2012-08-23 ENCOUNTER — Encounter (HOSPITAL_COMMUNITY): Payer: Medicare Other

## 2012-08-24 ENCOUNTER — Encounter (HOSPITAL_COMMUNITY): Payer: Medicare Other

## 2012-08-29 ENCOUNTER — Encounter (HOSPITAL_COMMUNITY): Payer: Medicare Other

## 2012-08-30 ENCOUNTER — Encounter (HOSPITAL_COMMUNITY): Payer: Medicare Other

## 2012-08-31 ENCOUNTER — Encounter (HOSPITAL_COMMUNITY): Payer: Medicare Other

## 2012-09-05 ENCOUNTER — Encounter (HOSPITAL_COMMUNITY): Payer: Medicare Other

## 2012-09-06 ENCOUNTER — Encounter (HOSPITAL_COMMUNITY): Payer: Medicare Other

## 2012-09-07 ENCOUNTER — Encounter (HOSPITAL_COMMUNITY): Payer: Medicare Other

## 2012-09-12 ENCOUNTER — Encounter (HOSPITAL_COMMUNITY): Payer: Medicare Other

## 2012-09-13 ENCOUNTER — Encounter (HOSPITAL_COMMUNITY): Payer: Medicare Other

## 2012-09-14 ENCOUNTER — Encounter (HOSPITAL_COMMUNITY): Payer: Medicare Other

## 2012-09-19 ENCOUNTER — Encounter (HOSPITAL_COMMUNITY): Payer: Medicare Other

## 2012-09-20 ENCOUNTER — Encounter (HOSPITAL_COMMUNITY): Payer: Medicare Other

## 2012-09-20 ENCOUNTER — Encounter: Payer: Self-pay | Admitting: Internal Medicine

## 2012-09-20 ENCOUNTER — Ambulatory Visit (INDEPENDENT_AMBULATORY_CARE_PROVIDER_SITE_OTHER): Payer: Medicare Other | Admitting: Internal Medicine

## 2012-09-20 VITALS — BP 130/80 | HR 85 | Ht 68.0 in | Wt 319.2 lb

## 2012-09-20 DIAGNOSIS — G4710 Hypersomnia, unspecified: Secondary | ICD-10-CM

## 2012-09-20 DIAGNOSIS — J984 Other disorders of lung: Secondary | ICD-10-CM

## 2012-09-20 DIAGNOSIS — R918 Other nonspecific abnormal finding of lung field: Secondary | ICD-10-CM

## 2012-09-20 DIAGNOSIS — G4730 Sleep apnea, unspecified: Secondary | ICD-10-CM

## 2012-09-20 NOTE — Patient Instructions (Addendum)
Order- lab- Quantiferon TB Gold assay     Dx lung nodules  We can continue CPAP 15/ Advanced  You can explore CPAP machines through your DME company or on-line like [email or web address]

## 2012-09-20 NOTE — Progress Notes (Signed)
08/02/11- 69 yoM former smoker followed for OSA, complicated by CAD/ MI, AFib, WPW, Lung nodule LOV-  at he continues using CPAP all night every night at 16 CWP/Advanced. Dislikes the fit of his full facemask. Likes Singulair, helps his breathing. Coronary artery disease got stented. Chest CT January 2012-lung nodule= a vascular nodule in the right lower lobe to be followed for 2 years.  09/20/12- 86 yoM former smoker followed for OSA, complicated by CAD/ MI, AFib, WPW, Lung nodule FOLLOWS FOR: wears CPAP 15/ Advanced every night for about 7.5 to 8 hours each night and pressure working well for patient. He credits Singulair with keeping airway stable. He used to have recurrent bronchitis. CT chest-08/18/11- reviewed IMPRESSION:  Stable 10 mm right lower lobe nodule. This has not (sic) been stable  over a 2-year time period and is therefore compatible with a benign  process.  Small scattered calcified granulomas.  Coronary artery disease.  Small left adrenal adenoma.  Original Report Authenticated By: Cyndie Chime, M.D.  ROS-see HPI Constitutional:   No-   weight loss, night sweats, fevers, chills, fatigue, lassitude. HEENT:   No-  headaches, difficulty swallowing, tooth/dental problems, sore throat,       No-  sneezing, itching, ear ache, nasal congestion, post nasal drip,  CV:  No-   chest pain, orthopnea, PND, swelling in lower extremities, anasarca, dizziness, palpitations Resp: No- acute  shortness of breath with exertion or at rest.              No-   productive cough,  No non-productive cough,  No- coughing up of blood.              No-   change in color of mucus.  No- wheezing.   Skin: No-   rash or lesions. GI:  No-   heartburn, indigestion, abdominal pain, nausea, vomiting,  GU: . MS:  No-   joint pain or swelling.   Neuro-     nothing unusual Psych:  No- change in mood or affect. No depression or anxiety.  No memory loss.  OBJ- Physical Exam General- Alert, Oriented,  Affect-appropriate, Distress- none acute, obese Skin- rash-none, lesions- none, excoriation- none Lymphadenopathy- none Head- atraumatic            Eyes- Gross vision intact, PERRLA, conjunctivae and secretions clear            Ears- Hearing, canals-normal            Nose- Clear, no-Septal dev, mucus, polyps, erosion, perforation             Throat- Mallampati II , mucosa clear , drainage- none, tonsils- atrophic Neck- flexible , trachea midline, no stridor , thyroid nl, carotid no bruit Chest - symmetrical excursion , unlabored           Heart/CV- RRR , no murmur , no gallop  , no rub, nl s1 s2                           - JVD- none , edema- none, stasis changes- none, varices- none           Lung- clear to P&A, wheeze- none, cough- none , dullness-none, rub- none           Chest wall-  Abd- Br/ Gen/ Rectal- Not done, not indicated Extrem- cyanosis- none, clubbing, none, atrophy- none, strength- nl Neuro- grossly intact to observation

## 2012-09-21 ENCOUNTER — Encounter (HOSPITAL_COMMUNITY): Payer: Medicare Other

## 2012-09-26 ENCOUNTER — Encounter (HOSPITAL_COMMUNITY): Payer: Medicare Other

## 2012-09-27 ENCOUNTER — Encounter (HOSPITAL_COMMUNITY): Payer: Medicare Other

## 2012-09-28 ENCOUNTER — Encounter (HOSPITAL_COMMUNITY): Payer: Medicare Other

## 2012-10-01 ENCOUNTER — Encounter: Payer: Self-pay | Admitting: Internal Medicine

## 2012-10-01 NOTE — Assessment & Plan Note (Signed)
Good CPAP compliance and control. Weight loss would help

## 2012-10-01 NOTE — Assessment & Plan Note (Signed)
This been followed long enough to establish stability consistent with a benign lesion. Plan- Quantiferon Gold TB assay for documented exclusion of TB risk related to lung nodule.

## 2012-10-03 ENCOUNTER — Encounter (HOSPITAL_COMMUNITY): Payer: Medicare Other

## 2012-10-04 ENCOUNTER — Encounter (HOSPITAL_COMMUNITY): Payer: Medicare Other

## 2012-10-05 ENCOUNTER — Encounter (HOSPITAL_COMMUNITY): Payer: Medicare Other

## 2012-10-09 ENCOUNTER — Other Ambulatory Visit: Payer: Self-pay | Admitting: Oncology

## 2012-10-10 ENCOUNTER — Encounter (HOSPITAL_COMMUNITY): Payer: Medicare Other

## 2012-10-11 ENCOUNTER — Encounter (HOSPITAL_COMMUNITY): Payer: Medicare Other

## 2012-10-12 ENCOUNTER — Encounter (HOSPITAL_COMMUNITY): Payer: Medicare Other

## 2012-10-17 ENCOUNTER — Encounter (HOSPITAL_COMMUNITY): Payer: Medicare Other

## 2012-10-18 ENCOUNTER — Encounter (HOSPITAL_COMMUNITY): Payer: Medicare Other

## 2012-10-19 ENCOUNTER — Encounter (HOSPITAL_COMMUNITY): Payer: Medicare Other

## 2012-10-24 ENCOUNTER — Encounter (HOSPITAL_COMMUNITY): Payer: Medicare Other

## 2012-10-25 ENCOUNTER — Encounter (HOSPITAL_COMMUNITY): Payer: Medicare Other

## 2012-10-26 ENCOUNTER — Encounter (HOSPITAL_COMMUNITY): Payer: Medicare Other

## 2012-10-31 ENCOUNTER — Encounter (HOSPITAL_COMMUNITY): Payer: Medicare Other

## 2012-11-01 ENCOUNTER — Encounter (HOSPITAL_COMMUNITY): Payer: Medicare Other

## 2012-11-02 ENCOUNTER — Encounter (HOSPITAL_COMMUNITY): Payer: Medicare Other

## 2012-11-07 ENCOUNTER — Encounter (HOSPITAL_COMMUNITY): Payer: Medicare Other

## 2012-11-08 ENCOUNTER — Encounter (HOSPITAL_COMMUNITY): Payer: Medicare Other

## 2012-11-09 ENCOUNTER — Encounter (HOSPITAL_COMMUNITY): Payer: Medicare Other

## 2012-11-14 ENCOUNTER — Telehealth: Payer: Self-pay | Admitting: Internal Medicine

## 2012-11-14 ENCOUNTER — Encounter (HOSPITAL_COMMUNITY): Payer: Medicare Other

## 2012-11-14 NOTE — Telephone Encounter (Signed)
Patient calling back.  He states to please leave the name of blood test on VM, he will be in a meeting.

## 2012-11-14 NOTE — Telephone Encounter (Signed)
Spoke with pt and given name of blood test that Dr Maple Hudson is requesting pt to have.  Pt will have this drawn at primary md office.

## 2012-11-14 NOTE — Telephone Encounter (Signed)
Patient Instructions    Order- lab- Quantiferon TB Gold assay Dx lung nodules   LMTCB

## 2012-11-15 ENCOUNTER — Encounter (HOSPITAL_COMMUNITY): Payer: Medicare Other

## 2012-11-16 ENCOUNTER — Encounter (HOSPITAL_COMMUNITY): Payer: Medicare Other

## 2012-11-21 ENCOUNTER — Encounter (HOSPITAL_COMMUNITY): Payer: Medicare Other

## 2012-11-22 ENCOUNTER — Encounter (HOSPITAL_COMMUNITY): Payer: Medicare Other

## 2012-11-23 ENCOUNTER — Encounter (HOSPITAL_COMMUNITY): Payer: Medicare Other

## 2012-11-28 ENCOUNTER — Encounter (HOSPITAL_COMMUNITY): Payer: Medicare Other

## 2012-11-29 ENCOUNTER — Encounter (HOSPITAL_COMMUNITY): Payer: Medicare Other

## 2012-11-30 ENCOUNTER — Encounter (HOSPITAL_COMMUNITY): Payer: Medicare Other

## 2012-12-05 ENCOUNTER — Encounter (HOSPITAL_COMMUNITY): Payer: Medicare Other

## 2012-12-06 ENCOUNTER — Encounter (HOSPITAL_COMMUNITY): Payer: Medicare Other

## 2012-12-07 ENCOUNTER — Encounter (HOSPITAL_COMMUNITY): Payer: Medicare Other

## 2012-12-12 ENCOUNTER — Encounter (HOSPITAL_COMMUNITY): Payer: Medicare Other

## 2012-12-13 ENCOUNTER — Encounter (HOSPITAL_COMMUNITY): Payer: Medicare Other

## 2012-12-14 ENCOUNTER — Encounter (HOSPITAL_COMMUNITY): Payer: Medicare Other

## 2012-12-15 ENCOUNTER — Encounter: Payer: Self-pay | Admitting: Cardiology

## 2012-12-18 ENCOUNTER — Encounter: Payer: Self-pay | Admitting: Cardiology

## 2012-12-19 ENCOUNTER — Encounter (HOSPITAL_COMMUNITY): Payer: Medicare Other

## 2012-12-20 ENCOUNTER — Encounter (HOSPITAL_COMMUNITY): Payer: Medicare Other

## 2012-12-21 ENCOUNTER — Encounter (HOSPITAL_COMMUNITY): Payer: Medicare Other

## 2012-12-26 ENCOUNTER — Encounter (HOSPITAL_COMMUNITY): Payer: Medicare Other

## 2012-12-27 ENCOUNTER — Encounter (HOSPITAL_COMMUNITY): Payer: Medicare Other

## 2012-12-28 ENCOUNTER — Encounter (HOSPITAL_COMMUNITY): Payer: Medicare Other

## 2013-01-02 ENCOUNTER — Encounter (HOSPITAL_COMMUNITY): Payer: Medicare Other

## 2013-01-03 ENCOUNTER — Encounter (HOSPITAL_COMMUNITY): Payer: Medicare Other

## 2013-01-04 ENCOUNTER — Encounter (HOSPITAL_COMMUNITY): Payer: Medicare Other

## 2013-01-09 ENCOUNTER — Encounter (HOSPITAL_COMMUNITY): Payer: Medicare Other

## 2013-01-10 ENCOUNTER — Encounter (HOSPITAL_COMMUNITY): Payer: Medicare Other

## 2013-01-11 ENCOUNTER — Encounter (HOSPITAL_COMMUNITY): Payer: Medicare Other

## 2013-01-16 ENCOUNTER — Encounter (HOSPITAL_COMMUNITY): Payer: Medicare Other

## 2013-01-17 ENCOUNTER — Encounter (HOSPITAL_COMMUNITY): Payer: Medicare Other

## 2013-01-18 ENCOUNTER — Encounter (HOSPITAL_COMMUNITY): Payer: Medicare Other

## 2013-01-23 ENCOUNTER — Encounter (HOSPITAL_COMMUNITY): Payer: Medicare Other

## 2013-01-24 ENCOUNTER — Encounter (HOSPITAL_COMMUNITY): Payer: Medicare Other

## 2013-01-25 ENCOUNTER — Encounter (HOSPITAL_COMMUNITY): Payer: Medicare Other

## 2013-01-30 ENCOUNTER — Encounter (HOSPITAL_COMMUNITY): Payer: Medicare Other

## 2013-01-31 ENCOUNTER — Encounter (HOSPITAL_COMMUNITY): Payer: Medicare Other

## 2013-02-01 ENCOUNTER — Encounter (HOSPITAL_COMMUNITY): Payer: Medicare Other

## 2013-02-06 ENCOUNTER — Encounter (HOSPITAL_COMMUNITY): Payer: Medicare Other

## 2013-02-07 ENCOUNTER — Encounter (HOSPITAL_COMMUNITY): Payer: Medicare Other

## 2013-02-08 ENCOUNTER — Encounter (HOSPITAL_COMMUNITY): Payer: Medicare Other

## 2013-02-13 ENCOUNTER — Encounter (HOSPITAL_COMMUNITY): Payer: Medicare Other

## 2013-02-14 ENCOUNTER — Encounter (HOSPITAL_COMMUNITY): Payer: Medicare Other

## 2013-02-15 ENCOUNTER — Encounter (HOSPITAL_COMMUNITY): Payer: Medicare Other

## 2013-02-19 ENCOUNTER — Other Ambulatory Visit: Payer: Self-pay | Admitting: Internal Medicine

## 2013-02-19 DIAGNOSIS — I2510 Atherosclerotic heart disease of native coronary artery without angina pectoris: Secondary | ICD-10-CM

## 2013-02-20 ENCOUNTER — Encounter (HOSPITAL_COMMUNITY): Payer: Medicare Other

## 2013-02-23 ENCOUNTER — Other Ambulatory Visit: Payer: Medicare Other

## 2013-03-26 ENCOUNTER — Inpatient Hospital Stay: Admission: RE | Admit: 2013-03-26 | Payer: Medicare Other | Source: Ambulatory Visit

## 2013-03-27 ENCOUNTER — Other Ambulatory Visit: Payer: Medicare Other

## 2013-03-28 ENCOUNTER — Other Ambulatory Visit: Payer: Self-pay | Admitting: Internal Medicine

## 2013-03-28 ENCOUNTER — Ambulatory Visit
Admission: RE | Admit: 2013-03-28 | Discharge: 2013-03-28 | Disposition: A | Discharge status: Home / Self Care | Payer: Medicare Other | Source: Ambulatory Visit | Attending: Internal Medicine | Admitting: Internal Medicine

## 2013-03-28 DIAGNOSIS — I2510 Atherosclerotic heart disease of native coronary artery without angina pectoris: Secondary | ICD-10-CM

## 2013-10-29 ENCOUNTER — Ambulatory Visit (INDEPENDENT_AMBULATORY_CARE_PROVIDER_SITE_OTHER): Payer: Medicare Other | Admitting: Internal Medicine

## 2013-10-29 ENCOUNTER — Encounter: Payer: Self-pay | Admitting: Internal Medicine

## 2013-10-29 VITALS — BP 130/82 | HR 95 | Ht 68.0 in | Wt 312.0 lb

## 2013-10-29 DIAGNOSIS — G4710 Hypersomnia, unspecified: Secondary | ICD-10-CM

## 2013-10-29 DIAGNOSIS — J984 Other disorders of lung: Secondary | ICD-10-CM

## 2013-10-29 DIAGNOSIS — E669 Obesity, unspecified: Secondary | ICD-10-CM

## 2013-10-29 DIAGNOSIS — G4730 Sleep apnea, unspecified: Secondary | ICD-10-CM

## 2013-10-29 DIAGNOSIS — G4733 Obstructive sleep apnea (adult) (pediatric): Secondary | ICD-10-CM

## 2013-10-29 NOTE — Progress Notes (Signed)
08/02/11- 20 yoM former smoker followed for OSA, complicated by CAD/ MI, AFib, WPW, Lung nodule LOV-  at he continues using CPAP all night every night at 16 CWP/Advanced. Dislikes the fit of his full facemask. Likes Singulair, helps his breathing. Coronary artery disease got stented. Chest CT January 2012-lung nodule= a vascular nodule in the right lower lobe to be followed for 2 years.  09/20/12- 14 yoM former smoker followed for OSA, complicated by CAD/ MI, AFib, WPW, Lung nodule FOLLOWS FOR: wears CPAP 15/ Advanced every night for about 7.5 to 8 hours each night and pressure working well for patient. He credits Singulair with keeping airway stable. He used to have recurrent bronchitis. CT chest-08/18/11- reviewed IMPRESSION:  Stable 10 mm right lower lobe nodule. This has not (sic) been stable  over a 2-year time period and is therefore compatible with a benign  process.  Small scattered calcified granulomas.  Coronary artery disease.  Small left adrenal adenoma.  Original Report Authenticated By: Cyndie Chime, M.D.  10/29/13- 20 yoM former smoker followed for OSA, complicated by CAD/ MI, AFib, WPW, Lung nodule FOLLOWS FOR:  Still wearing CPAP 8 / Advanced hours per night.  Form being faxed to fill out for energy purpose He says CPAP is "fine", used to using it routinely.he wears it all night every night. Nocturia x2  ROS-see HPI Constitutional:   No-   weight loss, night sweats, fevers, chills, fatigue, lassitude. HEENT:   No-  headaches, difficulty swallowing, tooth/dental problems, sore throat,       No-  sneezing, itching, ear ache, nasal congestion, post nasal drip,  CV:  No-   chest pain, orthopnea, PND, swelling in lower extremities, anasarca, dizziness, palpitations Resp: No- acute  shortness of breath with exertion or at rest.              No-   productive cough,  No non-productive cough,  No- coughing up of blood.              No-   change in color of mucus.  No- wheezing.    Skin: No-   rash or lesions. GI:  No-   heartburn, indigestion, abdominal pain, nausea, vomiting,  GU: . MS:  No-   joint pain or swelling.   Neuro-     nothing unusual Psych:  No- change in mood or affect. No depression or anxiety.  No memory loss.  OBJ- Physical Exam General- Alert, Oriented, Affect-appropriate, Distress- none acute, +morbid obese Skin- rash-none, lesions- none, excoriation- none Lymphadenopathy- none Head- atraumatic            Eyes- Gross vision intact, PERRLA, conjunctivae and secretions clear            Ears- Hearing, canals-normal            Nose- Clear, no-Septal dev, mucus, polyps, erosion, perforation             Throat- Mallampati II , mucosa clear , drainage- none, tonsils- atrophic Neck- flexible , trachea midline, no stridor , thyroid nl, carotid no bruit Chest - symmetrical excursion , unlabored           Heart/CV- RRR , no murmur , no gallop  , no rub, nl s1 s2                           - JVD- none , edema- none, stasis changes- none, varices- none  Lung- clear to P&A, wheeze- none, cough- none , dullness-none, rub- none           Chest wall-  Abd- Br/ Gen/ Rectal- Not done, not indicated Extrem- cyanosis- none, clubbing, none, atrophy- none, strength- nl Neuro- grossly intact to observation

## 2013-10-29 NOTE — Patient Instructions (Signed)
Order- download CPAP pressure and compliance Advanced       Dx OSA  We will take care of [employer] form when it arrives  Please call if we can help

## 2013-11-05 ENCOUNTER — Telehealth: Payer: Self-pay | Admitting: Internal Medicine

## 2013-11-05 NOTE — Telephone Encounter (Signed)
Attempted to call pt.  Richard Shannon

## 2013-11-06 NOTE — Telephone Encounter (Signed)
Called spoke with pt. Aware to have the forms re faxed to [phone]. Nothing further needed

## 2013-11-06 NOTE — Telephone Encounter (Signed)
Spoke with pt. [place]  Number there is [phone]. (I added this to demographics)  He states that he cannot drive 4 hrs to bring his machine in for download.  He states that he has a computer and could do it with this if they can instruct him how.  LM for Barbara CowerJason at [facility] to give them this info to contact pt. Pt also asking about [employer] that was to be mailed to Dr Maple HudsonYoung to fill out.  Katie , have you seen this form?

## 2013-11-06 NOTE — Telephone Encounter (Signed)
I have not gotten any forms on patient-he was to have them faxed here.

## 2013-11-07 ENCOUNTER — Telehealth: Payer: Self-pay | Admitting: Internal Medicine

## 2013-11-07 NOTE — Telephone Encounter (Signed)
Noted and will forward to CDY as an [place]

## 2013-11-15 ENCOUNTER — Telehealth: Payer: Self-pay | Admitting: Internal Medicine

## 2013-11-15 NOTE — Telephone Encounter (Signed)
Left message for pt and advised that CY had completed forms for [employer]-Medical Alert Form. Awaiting call back from pt

## 2013-11-21 ENCOUNTER — Telehealth: Payer: Self-pay | Admitting: Internal Medicine

## 2013-11-21 NOTE — Telephone Encounter (Signed)
The only documentation of recent contact with patient was 6.25.15 phone note: Hermelinda MedicusMeghan A Lawrence at 11/15/2013 5:04 PM     Left message for pt and advised that CY had completed forms for [employer]-Medical Alert Form.  Awaiting call back from pt   Meghan, do you know where the form is?  It is not in the brown folder up front.  Thanks. As per above, pt would like to be called tomorrow 7/2 between and 12N and 1pm - [facility].

## 2013-11-22 NOTE — Telephone Encounter (Signed)
Left detailed message to please advise us of what he wants done with [employer] forms he dropped of for CY complete. These forms are on Katie's desk. Awaiting call and info from pt on mail, fax or is he picking them up and a copy will need to be made to be scanned into pt' s chart

## 2013-11-26 NOTE — Telephone Encounter (Signed)
lmomtcb for pt 

## 2013-11-27 NOTE — Telephone Encounter (Signed)
Lm with family to have the pt call back  

## 2013-11-29 NOTE — Telephone Encounter (Signed)
Lm with family to have the pt call back today

## 2013-12-03 NOTE — Telephone Encounter (Signed)
Spoke with pt.  He would like [employer] Forms faxed to [employer] at the # provided on the forms. He is also requesting a copy be scanned into his chart for future use if needed. Lindie SpruceMeghan is working with Dr. Maple HudsonYoung today.  Pls advise if you still have these forms.  Pt is aware this will be taken care.  Please document once complete.  Thank you.

## 2013-12-03 NOTE — Telephone Encounter (Signed)
Spoke with pt's son in law and advised that pt needed to sign [employer] forms. I will fax these forms back in [phone] and place copy to be scanned into pt's chart. I will also leave the original in Katie's box so that when pt gets back he can pick up these forms to sign Nothing further needed

## 2013-12-28 NOTE — Assessment & Plan Note (Signed)
Weight loss strongly advised

## 2013-12-28 NOTE — Assessment & Plan Note (Signed)
Good long-term compliance and control. He is comfortable with it and feels it helps him. Plan-[employer] form, request download for pressure compliance

## 2014-05-01 ENCOUNTER — Encounter (HOSPITAL_COMMUNITY): Payer: Self-pay | Admitting: Cardiovascular Disease

## 2014-10-25 ENCOUNTER — Encounter (HOSPITAL_COMMUNITY): Payer: Self-pay

## 2014-10-25 ENCOUNTER — Other Ambulatory Visit: Payer: Self-pay | Admitting: Cardiology

## 2014-10-25 ENCOUNTER — Ambulatory Visit (HOSPITAL_COMMUNITY)
Admission: RE | Admit: 2014-10-25 | Discharge: 2014-10-25 | Disposition: A | Discharge status: Home / Self Care | Payer: Medicare Other | Source: Ambulatory Visit | Attending: Surgery | Admitting: Surgery

## 2014-10-25 DIAGNOSIS — I6523 Occlusion and stenosis of bilateral carotid arteries: Secondary | ICD-10-CM | POA: Diagnosis not present

## 2014-10-25 DIAGNOSIS — I6529 Occlusion and stenosis of unspecified carotid artery: Secondary | ICD-10-CM

## 2014-10-28 ENCOUNTER — Telehealth: Payer: Self-pay | Admitting: Internal Medicine

## 2014-10-28 NOTE — Telephone Encounter (Signed)
Spoke with pt.  Pt scheduled to see TP on June 13 at 10:15 am.  Pt very appreciative of this.  Dr. Maple HudsonYoung, pt would like to know if you have any recs on a [place] in [place]

## 2014-10-28 NOTE — Telephone Encounter (Signed)
Florentina AddisonKatie,   Pt is requesting to coming in on 11/04/14 to f/u for OSA and states he will be in town that day. Will you look at the schedule and let pt know? I informed him it may not be possible. He states he doesn't live here anymore and has to drive 4 hours to get here. Thanks.

## 2014-10-28 NOTE — Telephone Encounter (Signed)
Per CY-lets have patient come in to see TP that day for quick/brief follow up and then we will need to have him in the meantime find someone more local to him to get follow up care from then on. We can send records to that MD if needed. Thanks.

## 2014-10-30 NOTE — Telephone Encounter (Signed)
CY please advise of any recs if any for pulmonologists for pt.  Thanks!

## 2014-10-31 NOTE — Telephone Encounter (Signed)
I don't know anybody down there. Suggest he ask his primary doctor down there to refer him to someone they know.

## 2014-10-31 NOTE — Telephone Encounter (Signed)
lmtcb x1 for pt. 

## 2014-11-01 ENCOUNTER — Encounter (HOSPITAL_COMMUNITY): Payer: Self-pay

## 2014-11-01 ENCOUNTER — Ambulatory Visit: Payer: Medicare Other | Admitting: Internal Medicine

## 2014-11-01 NOTE — Telephone Encounter (Signed)
Patient notified.  Nothing further needed. 

## 2014-11-04 ENCOUNTER — Ambulatory Visit: Payer: Medicare Other | Admitting: Adult Health

## 2014-11-06 ENCOUNTER — Telehealth: Payer: Self-pay | Admitting: Internal Medicine

## 2014-11-06 NOTE — Telephone Encounter (Signed)
lmtcb X1 for pt  

## 2014-11-06 NOTE — Telephone Encounter (Signed)
I no longer know the pulmonary doctors in [place]. I suggest he establish a primary doctor there, who can refer him as needed. He can also call the local hospital sleep ceter and ask for names of sleep doctors who use that lab.

## 2014-11-06 NOTE — Telephone Encounter (Signed)
Pt is aware of CY's recommendation. He agreed and verbalized understanding. Nothing further was needed.

## 2014-11-06 NOTE — Telephone Encounter (Signed)
Spoke with pt. He is asking for referral to see a provider in [place]. He no longer lives in [place]. Please advise CDY thanks

## 2015-06-03 IMAGING — US US CAROTID DUPLEX BILAT
1 series · 13 of 24 positions shown · non-contrast
Comparison: None.

CLINICAL DATA: CAD (post coronary artery stenting), hypertension,
history of atrial fibrillation and ablation, history of
hypertension, hyperlipidemia, borderline diabetes, former smoker

EXAM:
BILATERAL CAROTID DUPLEX ULTRASOUND
TECHNIQUE: Gray scale imaging, color Doppler and duplex ultrasound were
performed of bilateral carotid and vertebral arteries in the neck.

[Series 1: us carotid duplex bilat · 0.10mm/px · 13 of 72 slices shown]
[im 1/72]
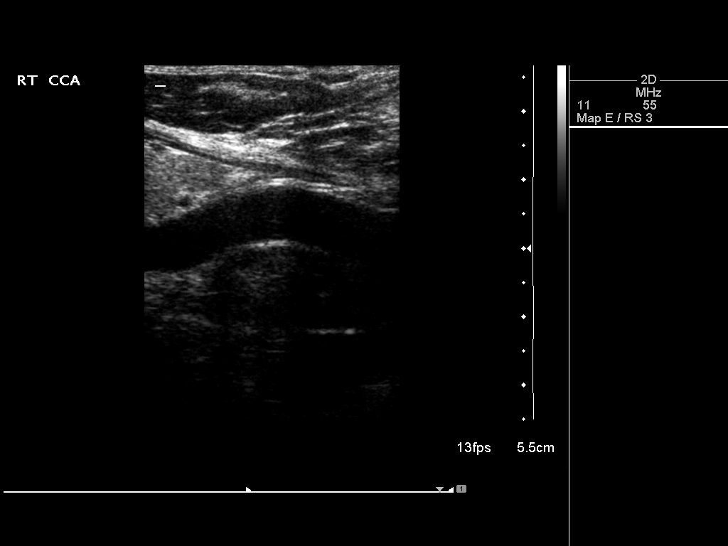
[im 7/72]
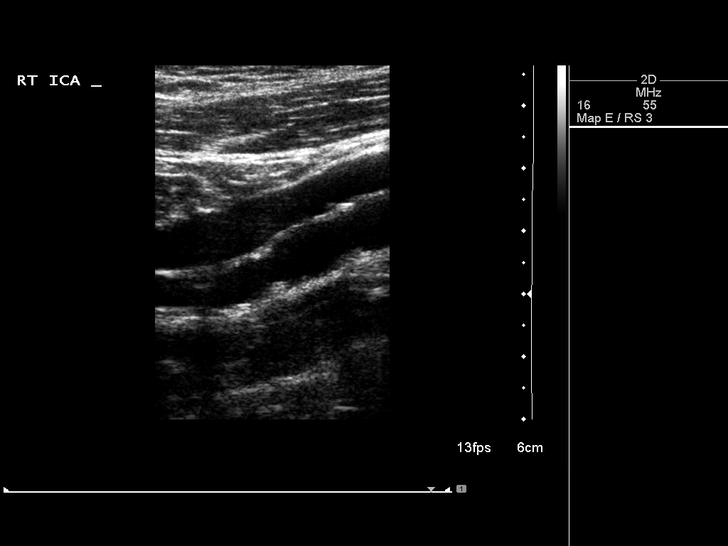
[im 13/72]
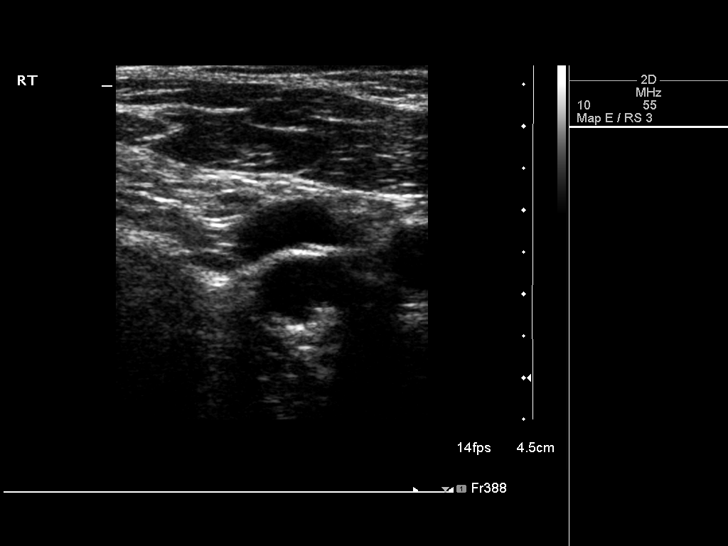
[im 19/72]
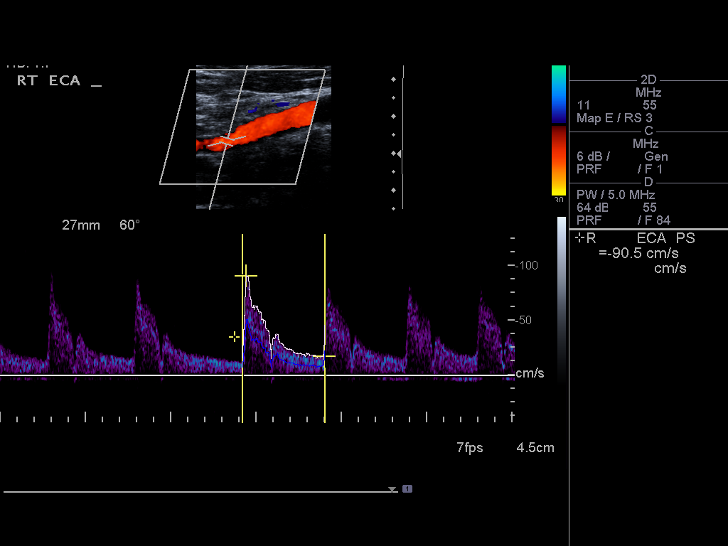
[im 25/72]
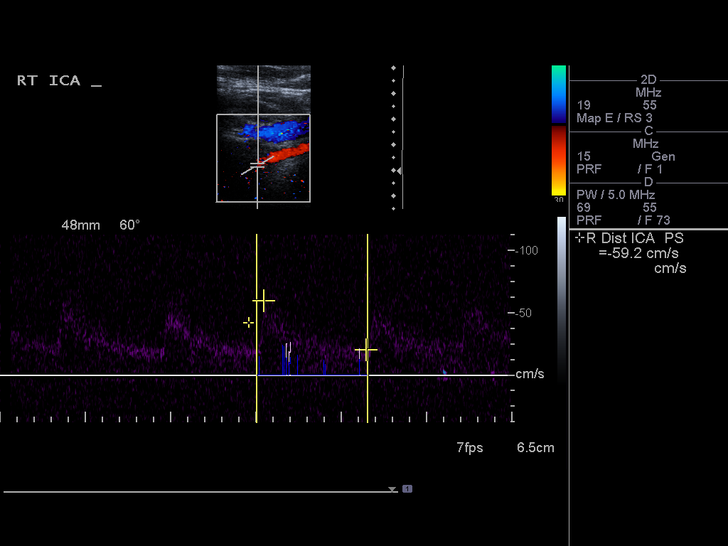
[im 31/72]
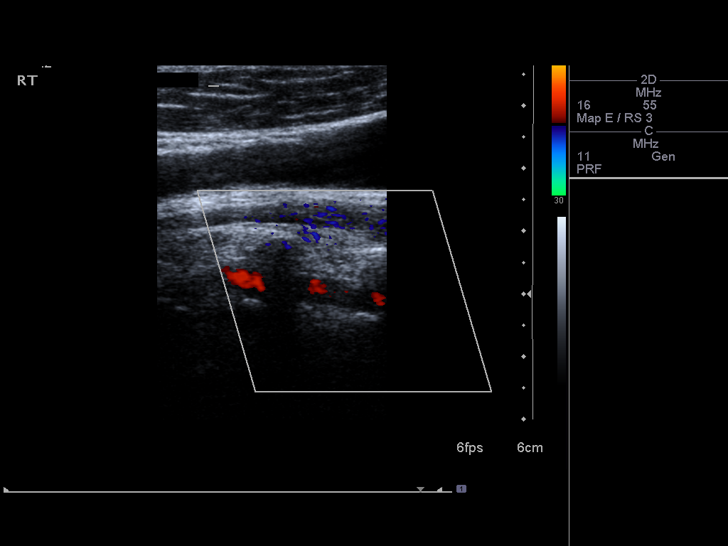
[im 38/72]
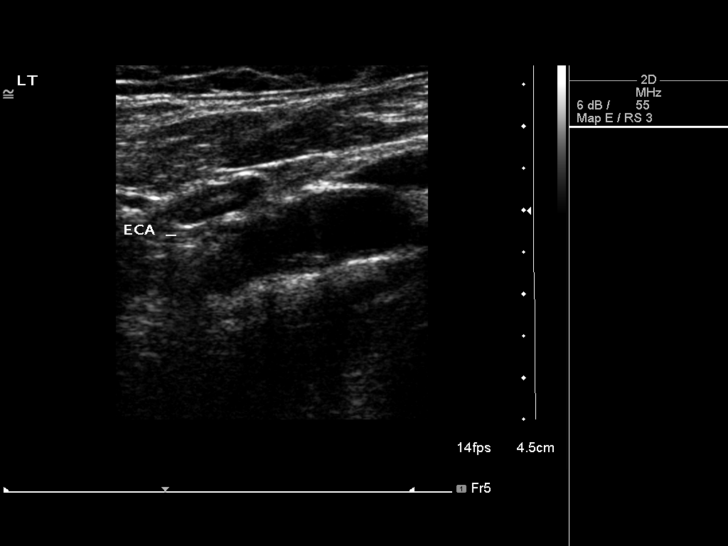
[im 41/72]
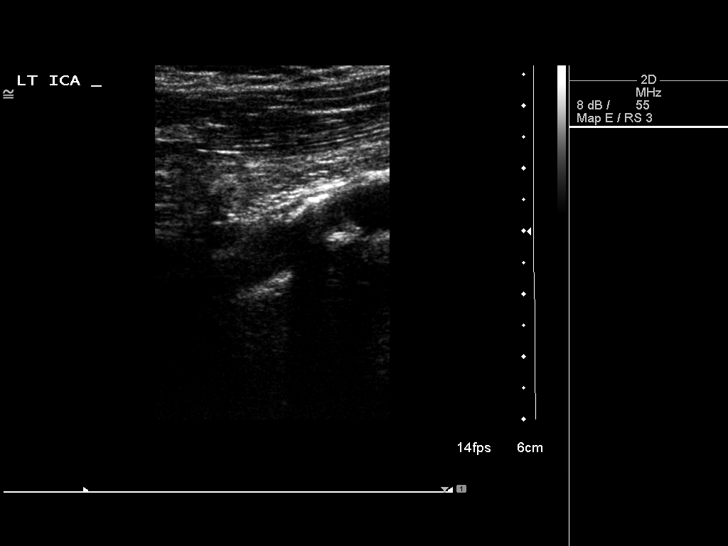
[im 47/72]
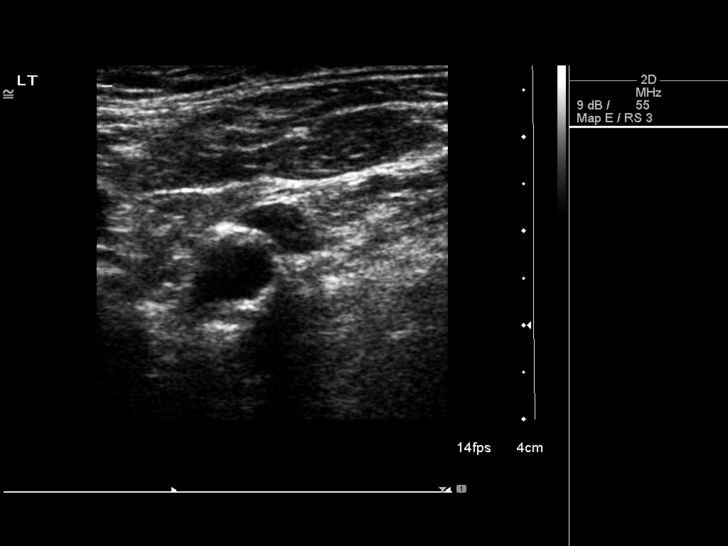
[im 53/72]
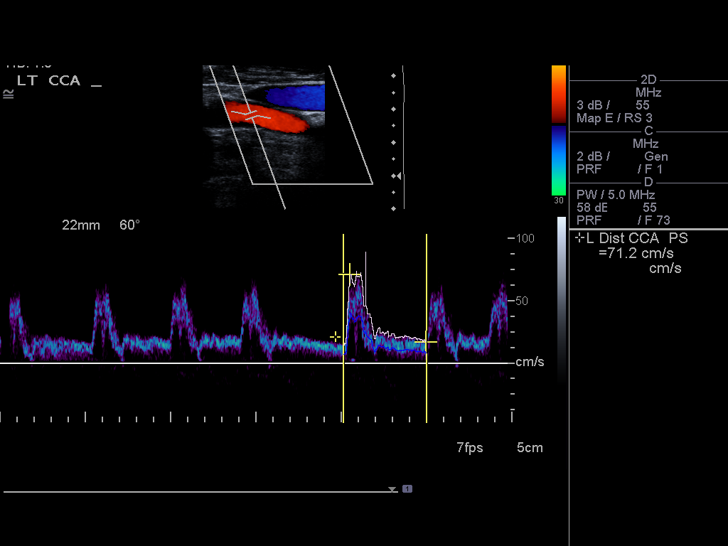
[im 59/72]
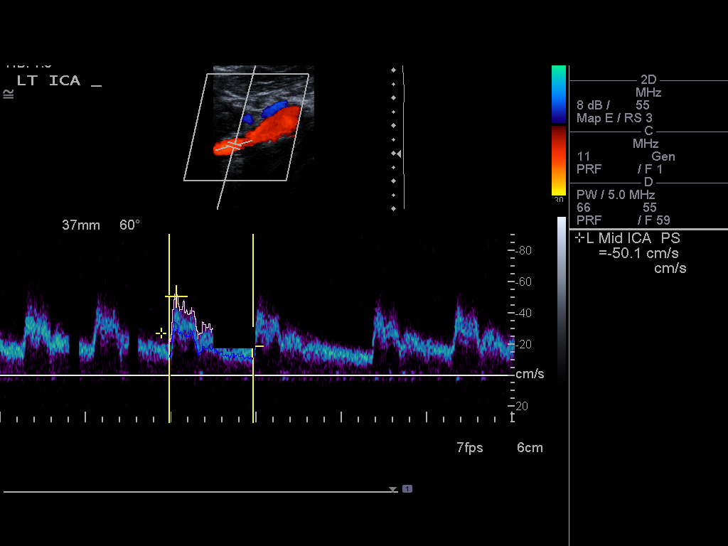
[im 65/72]
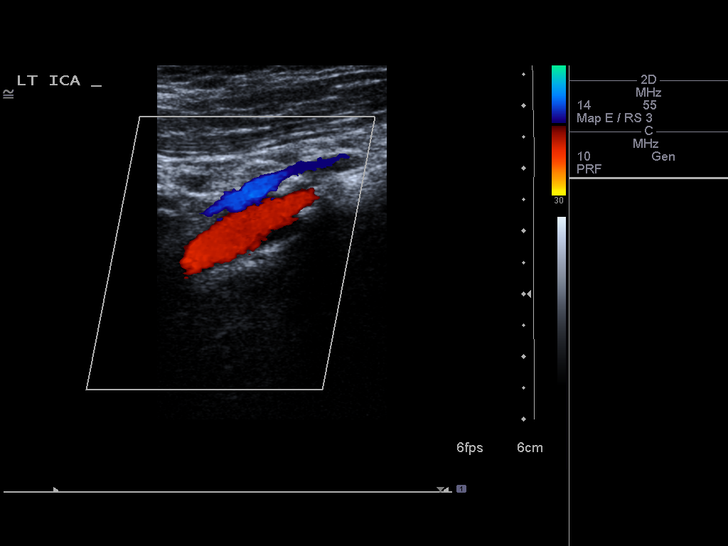
[im 72/72]
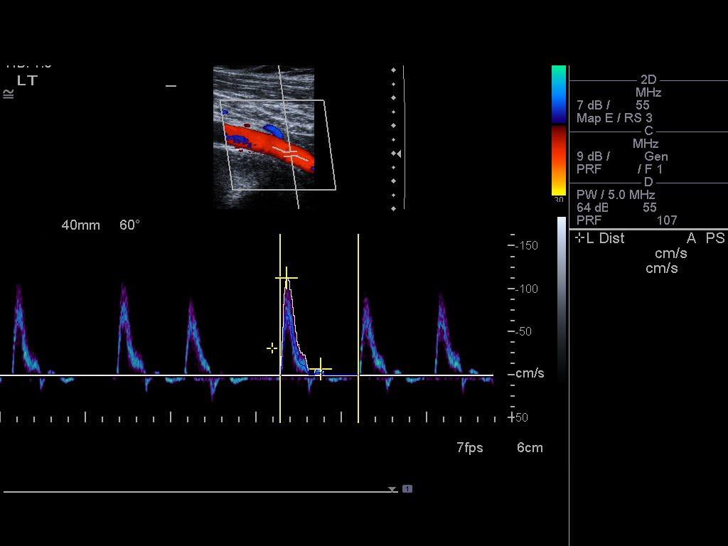

[13 of 24 positions shown; findings below may reference images not displayed]

FINDINGS: Criteria: Quantification of carotid stenosis is based on velocity
parameters that correlate the residual internal carotid diameter
with NASCET-based stenosis levels, using the diameter of the distal
internal carotid lumen as the denominator for stenosis measurement.

The following velocity measurements were obtained:

RIGHT

ICA:  73/22 cm/sec

CCA:  104/20 cm/sec

SYSTOLIC ICA/CCA RATIO:

DIASTOLIC ICA/CCA RATIO:

ECA:  91 cm/sec

LEFT

ICA:  52/19 cm/sec

CCA:  148/34 cm/sec

SYSTOLIC ICA/CCA RATIO:

DIASTOLIC ICA/CCA RATIO:

ECA:  74 cm/sec

RIGHT CAROTID ARTERY: There is slightly centric interval wall
thickening of the distal aspect of the right common carotid artery
(image 4). There are scattered foci eccentric mixed echogenic plaque
within the right carotid bulb and proximal aspect of the right
internal carotid artery (images 7 and 8), not resulting in elevated
peak systolic velocities within the right internal carotid artery to
suggest a hemodynamically significant stenosis.

RIGHT VERTEBRAL ARTERY:  Antegrade flow

LEFT CAROTID ARTERY: The left common carotid artery is noted to be
tortuous. There is a minimal amount of eccentric mixed echogenic
plaque within the left carotid bulb (images 39 and 40) extending to
involve the origin proximal aspect of the left internal carotid
artery (image 42), not resulting in elevated peak systolic
velocities within the left internal carotid artery to suggest a
hemodynamically significant stenosis.

LEFT VERTEBRAL ARTERY:  Antegrade flow
IMPRESSION: Bilateral atherosclerotic plaque, left subjectively greater than
right, not resulting in hemodynamically significant stenosis.

## 2015-06-03 IMAGING — US US AORTA
1 series · 11 of 11 positions shown · non-contrast
Comparison: PET-CT 05/27/2010

CLINICAL DATA: Screening, asymptomatic

EXAM:
ULTRASOUND OF ABDOMINAL AORTA
TECHNIQUE: Ultrasound examination of the abdominal aorta was performed to
evaluate for abdominal aortic aneurysm.

[Series 1: us aorta · 11 of 11 slices shown]
[im 1/11]
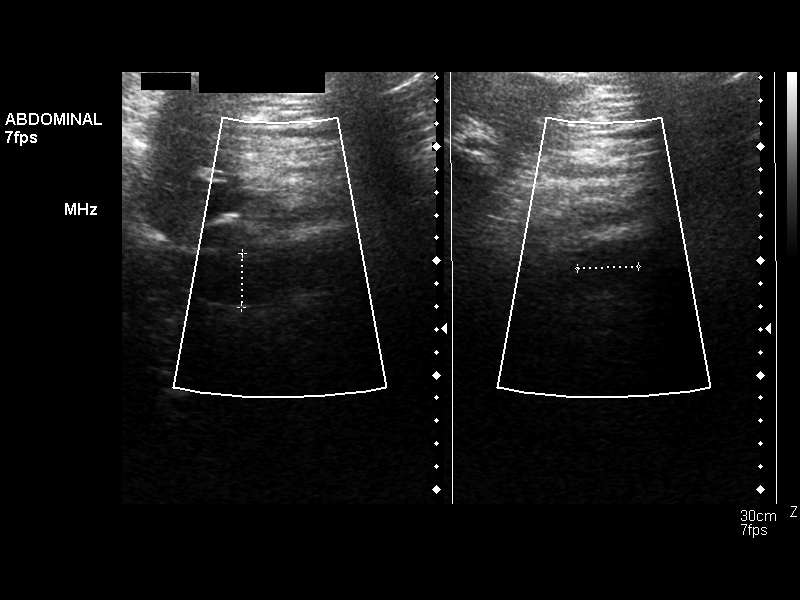
[im 2/11]
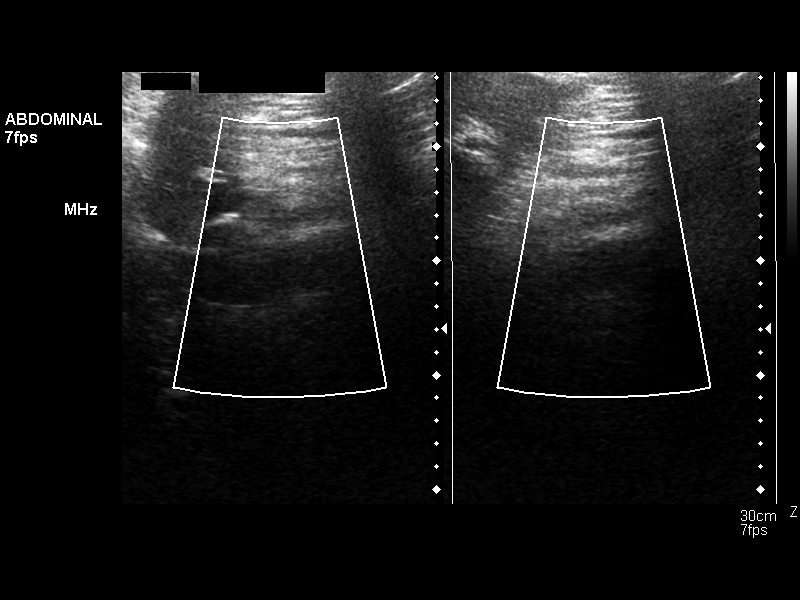
[im 3/11]
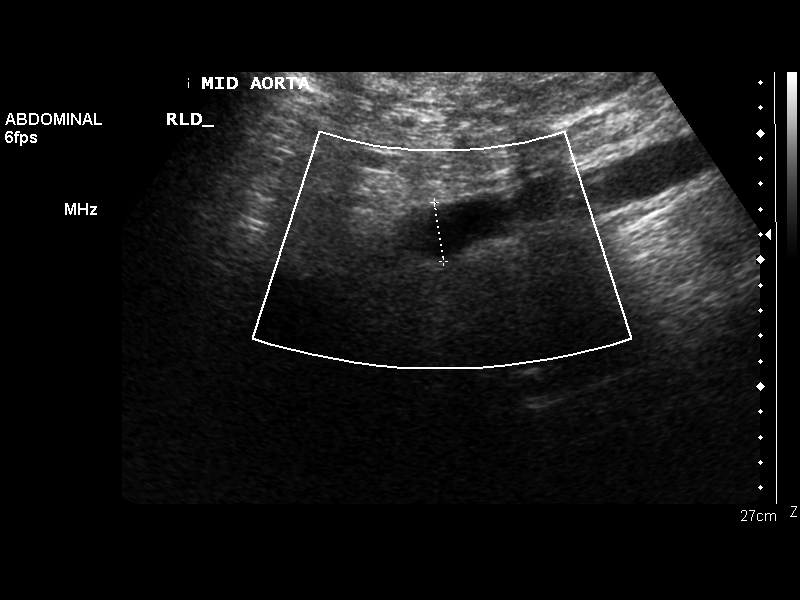
[im 4/11]
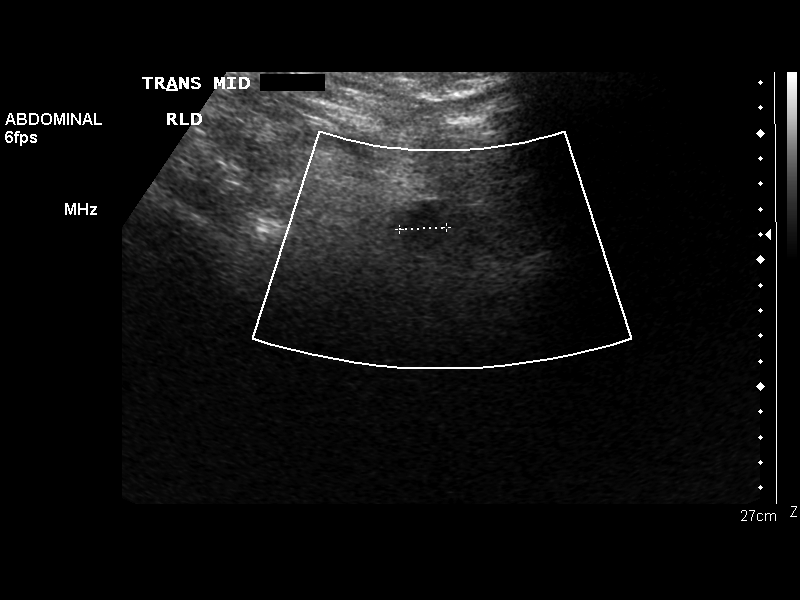
[im 5/11]
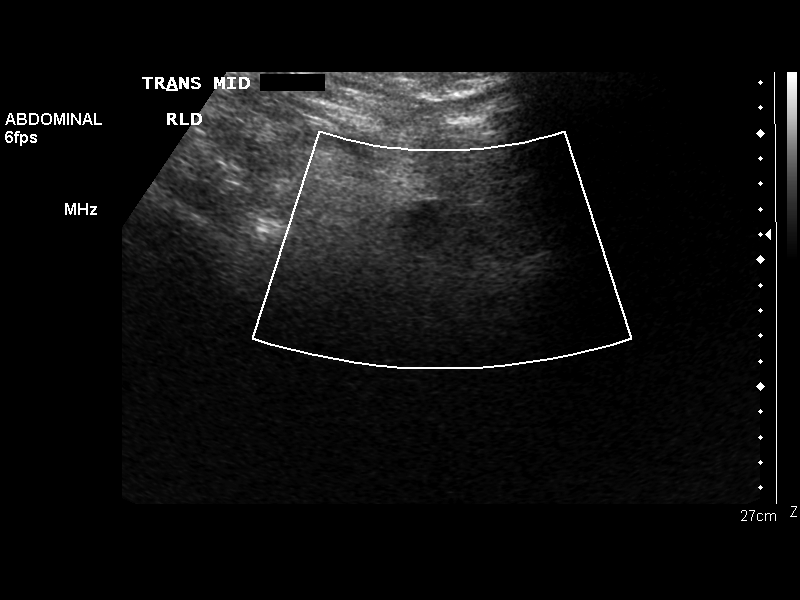
[im 6/11]
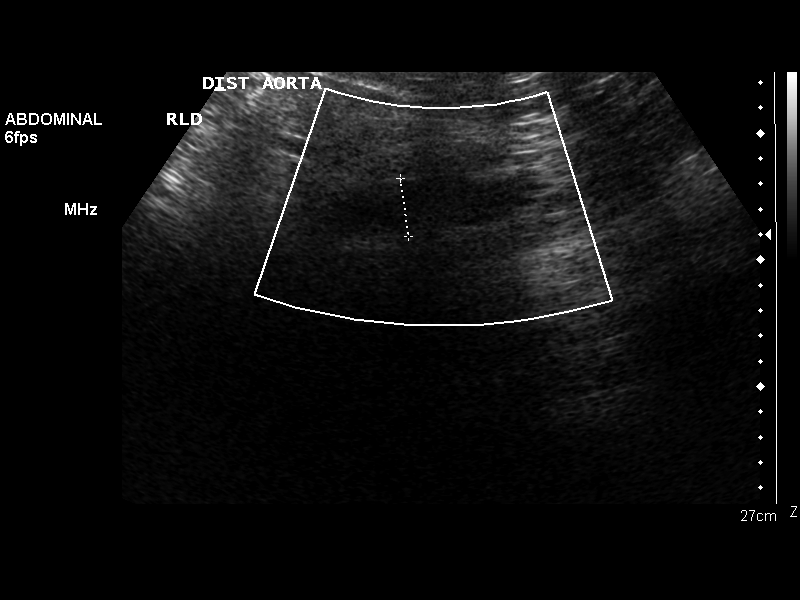
[im 7/11]
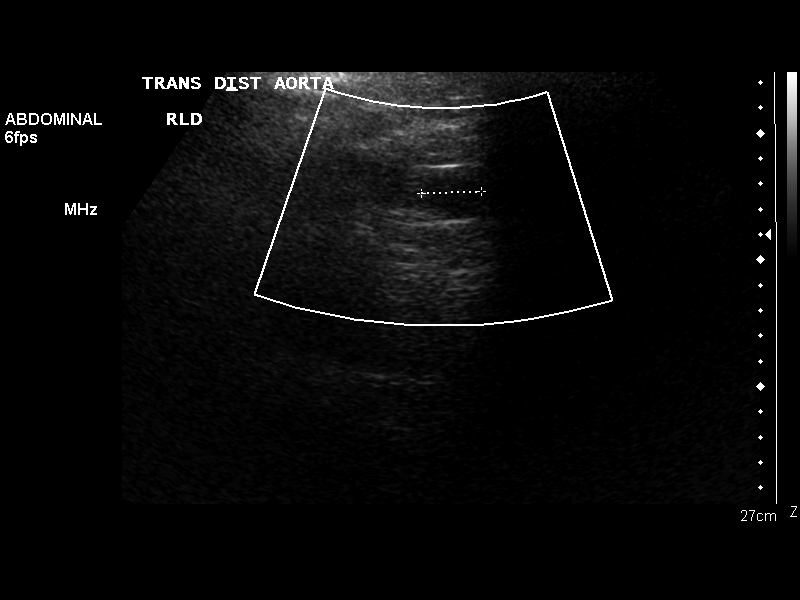
[im 8/11]
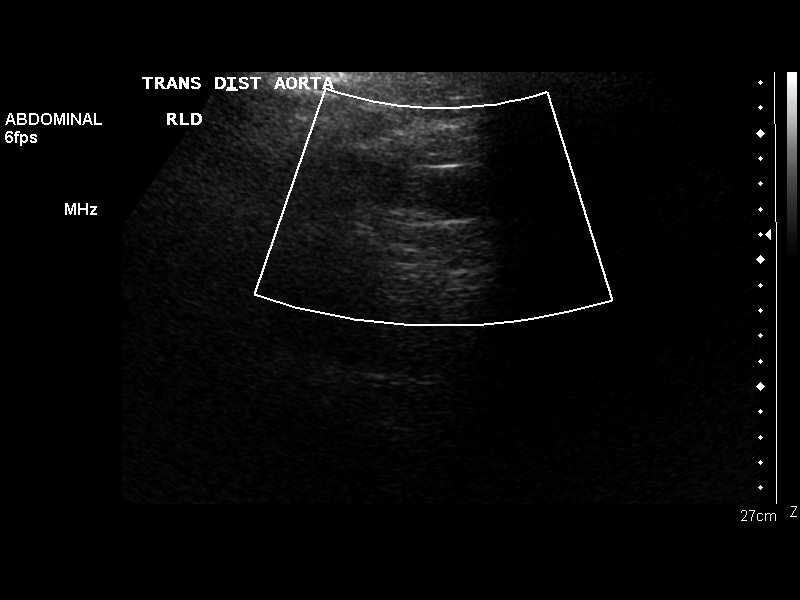
[im 9/11]
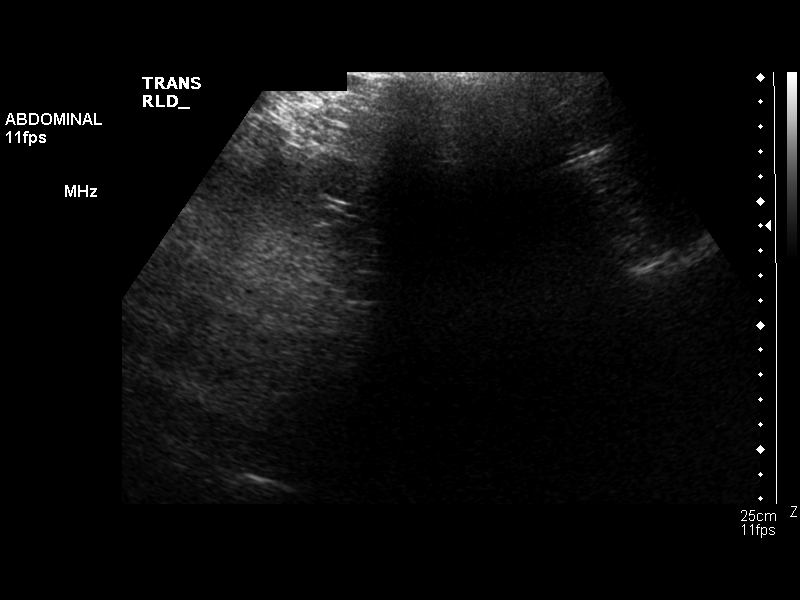
[im 10/11]
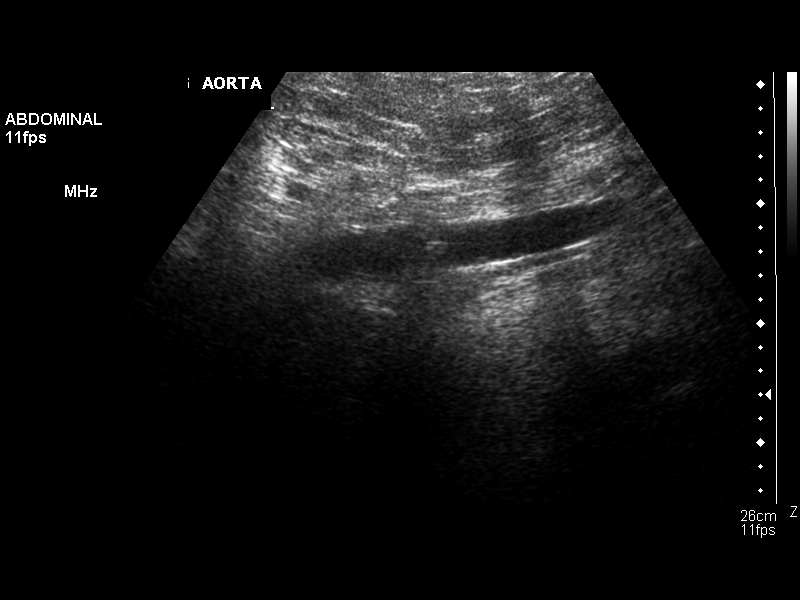
[im 11/11]
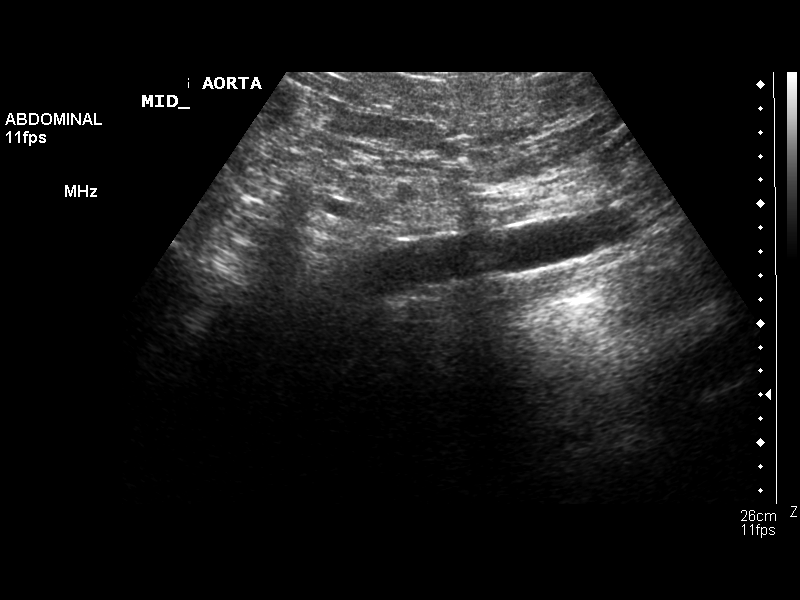

[11 of 11 positions shown; findings below may reference images not displayed]

FINDINGS: Abdominal Aorta

No aneurysm identified.

Maximum AP

Diameter:  2.4 cm

Maximum TRV

Diameter: 2.7 cm
IMPRESSION: Negative for aortic aneurysm.

## 2018-09-21 ENCOUNTER — Telehealth: Payer: Self-pay | Admitting: Cardiology

## 2018-09-21 NOTE — Telephone Encounter (Signed)
Previous patient of S. Donnie Aho.  Mailed patient a records release form to [address], [place], [place] [address] which is where he is currently staying through the summer.  Patient will mail back when complete
